# Patient Record
Sex: Female | Born: 1990 | Race: Black or African American | Hispanic: No | Marital: Single | Smoking: Former smoker
Health system: Southern US, Community
[De-identification: ages and names within clinical notes are randomized; demographics above are authoritative.]

## PROBLEM LIST (undated history)

## (undated) DIAGNOSIS — F32A Depression, unspecified: Secondary | ICD-10-CM

## (undated) DIAGNOSIS — A64 Unspecified sexually transmitted disease: Secondary | ICD-10-CM

## (undated) DIAGNOSIS — F329 Major depressive disorder, single episode, unspecified: Secondary | ICD-10-CM

## (undated) DIAGNOSIS — M1990 Unspecified osteoarthritis, unspecified site: Secondary | ICD-10-CM

## (undated) DIAGNOSIS — F3181 Bipolar II disorder: Secondary | ICD-10-CM

## (undated) DIAGNOSIS — G4730 Sleep apnea, unspecified: Secondary | ICD-10-CM

## (undated) HISTORY — PX: NO PAST SURGERIES: SHX2092

## (undated) HISTORY — DX: Bipolar II disorder: F31.81

## (undated) HISTORY — DX: Unspecified osteoarthritis, unspecified site: M19.90

## (undated) HISTORY — DX: Major depressive disorder, single episode, unspecified: F32.9

## (undated) HISTORY — DX: Sleep apnea, unspecified: G47.30

## (undated) HISTORY — DX: Depression, unspecified: F32.A

---
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
## 2010-01-07 ENCOUNTER — Emergency Department (HOSPITAL_BASED_OUTPATIENT_CLINIC_OR_DEPARTMENT_OTHER): Admission: EM | Admit: 2010-01-07 | Discharge: 2010-01-07 | Payer: Self-pay | Admitting: Emergency Medicine

## 2010-01-07 ENCOUNTER — Ambulatory Visit: Payer: Self-pay | Admitting: Diagnostic Radiology

## 2010-07-20 LAB — URINE CULTURE: Colony Count: 25000

## 2010-07-20 LAB — URINALYSIS, ROUTINE W REFLEX MICROSCOPIC
Glucose, UA: NEGATIVE mg/dL
Protein, ur: NEGATIVE mg/dL
Specific Gravity, Urine: 1.03 (ref 1.005–1.030)
pH: 5.5 (ref 5.0–8.0)

## 2010-07-20 LAB — PREGNANCY, URINE: Preg Test, Ur: NEGATIVE

## 2010-08-14 LAB — URINALYSIS, ROUTINE W REFLEX MICROSCOPIC
Bilirubin Urine: NEGATIVE
Glucose, UA: NEGATIVE mg/dL
Ketones, ur: NEGATIVE mg/dL
Nitrite: POSITIVE — AB
Protein, ur: 100 mg/dL — AB
Specific Gravity, Urine: >1.03 — ABNORMAL HIGH (ref 1.005–1.030)

## 2010-08-14 LAB — WET PREP, GENITAL: Trich, Wet Prep: NONE SEEN

## 2010-08-14 LAB — GC/CHLAMYDIA PROBE AMP, GENITAL: GC Probe Amp, Genital: NEGATIVE

## 2010-10-27 ENCOUNTER — Inpatient Hospital Stay (HOSPITAL_COMMUNITY)
Admission: AD | Admit: 2010-10-27 | Discharge: 2010-10-27 | Disposition: A | Discharge status: Home / Self Care | Payer: Self-pay | Source: Ambulatory Visit | Attending: Obstetrics & Gynecology | Admitting: Obstetrics & Gynecology

## 2010-10-27 DIAGNOSIS — R197 Diarrhea, unspecified: Secondary | ICD-10-CM

## 2010-10-27 DIAGNOSIS — F319 Bipolar disorder, unspecified: Secondary | ICD-10-CM

## 2010-10-27 DIAGNOSIS — R109 Unspecified abdominal pain: Secondary | ICD-10-CM

## 2010-10-27 DIAGNOSIS — N898 Other specified noninflammatory disorders of vagina: Secondary | ICD-10-CM

## 2010-10-27 LAB — URINE MICROSCOPIC-ADD ON

## 2010-10-27 LAB — URINALYSIS, ROUTINE W REFLEX MICROSCOPIC
Glucose, UA: NEGATIVE mg/dL
Ketones, ur: NEGATIVE mg/dL
Leukocytes, UA: NEGATIVE
Specific Gravity, Urine: >1.03 — ABNORMAL HIGH (ref 1.005–1.030)
Urobilinogen, UA: 0.2 mg/dL (ref 0.0–1.0)

## 2010-10-27 LAB — CBC
MCV: 91.1 fL (ref 78.0–100.0)
RBC: 3.84 MIL/uL — ABNORMAL LOW (ref 3.87–5.11)
WBC: 11.2 10*3/uL — ABNORMAL HIGH (ref 4.0–10.5)

## 2010-10-27 LAB — WET PREP, GENITAL: Trich, Wet Prep: NONE SEEN

## 2010-10-27 LAB — POCT PREGNANCY, URINE: Preg Test, Ur: NEGATIVE

## 2010-10-31 LAB — GC/CHLAMYDIA PROBE AMP, GENITAL
Chlamydia, DNA Probe: POSITIVE — AB
GC Probe Amp, Genital: NEGATIVE

## 2011-02-06 LAB — URINE MICROSCOPIC-ADD ON

## 2011-02-06 LAB — WET PREP, GENITAL
Clue Cells Wet Prep HPF POC: NONE SEEN
WBC, Wet Prep HPF POC: NONE SEEN
Yeast Wet Prep HPF POC: NONE SEEN

## 2011-02-06 LAB — URINALYSIS, ROUTINE W REFLEX MICROSCOPIC
Glucose, UA: NEGATIVE mg/dL
pH: 7 (ref 5.0–8.0)

## 2011-02-06 LAB — RPR: RPR Ser Ql: NONREACTIVE

## 2011-02-09 LAB — POCT URINALYSIS DIP (DEVICE)
Bilirubin Urine: NEGATIVE
Hgb urine dipstick: NEGATIVE
Ketones, ur: NEGATIVE mg/dL
Protein, ur: NEGATIVE mg/dL
Specific Gravity, Urine: 1.015 (ref 1.005–1.030)
pH: 7 (ref 5.0–8.0)

## 2011-02-13 LAB — DIFFERENTIAL
Basophils Relative: 0
Eosinophils Absolute: 0 — ABNORMAL LOW
Eosinophils Relative: 0
Lymphs Abs: 1.6
Neutrophils Relative %: 70

## 2011-02-13 LAB — CBC
HCT: 36.1
MCHC: 35
MCV: 91.6
Platelets: 311
RDW: 12.7
WBC: 9.8

## 2011-02-13 LAB — BASIC METABOLIC PANEL
BUN: 4 — ABNORMAL LOW
CO2: 24
Chloride: 98
Creatinine, Ser: 0.53
Glucose, Bld: 92
Potassium: 3.7

## 2011-02-13 LAB — PREGNANCY, URINE: Preg Test, Ur: NEGATIVE

## 2011-03-26 ENCOUNTER — Emergency Department (HOSPITAL_COMMUNITY)
Admission: EM | Admit: 2011-03-26 | Discharge: 2011-03-26 | Disposition: A | Discharge status: Home / Self Care | Payer: Self-pay | Attending: Emergency Medicine | Admitting: Emergency Medicine

## 2011-03-26 ENCOUNTER — Encounter: Payer: Self-pay | Admitting: *Deleted

## 2011-03-26 DIAGNOSIS — X58XXXA Exposure to other specified factors, initial encounter: Secondary | ICD-10-CM | POA: Insufficient documentation

## 2011-03-26 DIAGNOSIS — H16009 Unspecified corneal ulcer, unspecified eye: Secondary | ICD-10-CM | POA: Insufficient documentation

## 2011-03-26 DIAGNOSIS — S058X9A Other injuries of unspecified eye and orbit, initial encounter: Secondary | ICD-10-CM | POA: Insufficient documentation

## 2011-03-26 DIAGNOSIS — S0500XA Injury of conjunctiva and corneal abrasion without foreign body, unspecified eye, initial encounter: Secondary | ICD-10-CM

## 2011-03-26 MED ORDER — PROPARACAINE HCL 0.5 % OP SOLN
OPHTHALMIC | Status: AC
  Administered 2011-03-26: 12:00:00
  Filled 2011-03-26: qty 15

## 2011-03-26 MED ORDER — HYDROCODONE-ACETAMINOPHEN 5-325 MG PO TABS: 1.0000 | ORAL_TABLET | Freq: Four times a day (QID) | ORAL | Status: AC | PRN

## 2011-03-26 MED ORDER — TETRACAINE HCL 0.5 % OP SOLN: 2.0000 [drp] | Freq: Once | OPHTHALMIC | Status: DC

## 2011-03-26 MED ORDER — SULFACETAMIDE SODIUM 10 % OP SOLN: 2.0000 [drp] | OPHTHALMIC | Status: AC

## 2011-03-26 NOTE — ED Notes (Signed)
Patient given discharge instructions, information, prescriptions, and diet order. Patient states that they adequately understand discharge information given and to return to ED if symptoms return or worsen.    Pt given follow up information. Patient given 2 prescriptions.

## 2011-03-26 NOTE — ED Notes (Signed)
Pt states she has contacts that she has been wearing for a year. Pt states she also allergic reaction to apple juice and drink apple juice on sat.  Pt states her eye is swollen and draining

## 2011-03-26 NOTE — ED Provider Notes (Signed)
History     CSN: [id number] Arrival date & time: 03/26/2011 11:09 AM   First MD Initiated Contact with Patient 03/26/11 1204      Chief Complaint  Patient presents with  . Eye Pain    left eye, onset on Saturday    (Consider location/radiation/quality/duration/timing/severity/associated sxs/prior treatment) HPI Patient states that 3 days ago.  She started having pain in her left eye.  She states that she also wears contact lenses and slept in them the night before.  She states that the light bothers her eyes, along with movement of her eye.  She denies visual loss, headache, vomiting, nausea, or known trauma.  Past Medical History  Diagnosis Date  . Bipolar 1 disorder     History reviewed. No pertinent past surgical history.  Family History  Problem Relation Age of Onset  . Diabetes Father   . Stroke Father     History  Substance Use Topics  . Smoking status: Former Games developer  . Smokeless tobacco: Former Neurosurgeon  . Alcohol Use: Yes     pt states she drinks a few times a week    OB History    Grav Para Term Preterm Abortions TAB SAB Ect Mult Living                  Review of Systems  All other systems reviewed and are negative.   All positives and negatives were reviewed in the history of present illness Allergies  Apple juice  Home Medications   Current Outpatient Rx  Name Route Sig Dispense Refill  . IBUPROFEN 200 MG PO TABS Oral Take 400 mg by mouth every 6 (six) hours as needed. For headache    . NAPHAZOLINE HCL 0.012 % OP SOLN Both Eyes Place 2 drops into both eyes 4 (four) times daily. For eyes hurting       BP 126/86  Pulse 92  Temp(Src) 98.7 F (37.1 C) (Oral)  Resp 18  SpO2 99%  LMP 02/23/2011  Physical Exam  Constitutional: She appears well-developed and well-nourished.  HENT:  Head: Normocephalic and atraumatic.  Eyes:  Slit lamp exam:      The left eye shows corneal abrasion, corneal ulcer and fluorescein uptake. The left eye shows no  foreign body.  Neck: Normal range of motion. Neck supple.  Cardiovascular: Normal rate and regular rhythm.   Pulmonary/Chest: Effort normal and breath sounds normal.  Skin: No rash noted.    ED Course  Procedures (including critical care time)        MDM   Patient referred to ophthalmology for further evaluation.  She is a central pinpoint area of fluorescein dye uptake on the cornea.        Carlyle Dolly, [place] 03/26/11 1302

## 2011-03-28 NOTE — ED Provider Notes (Signed)
Medical screening examination/treatment/procedure(s) were conducted as a shared visit with non-physician practitioner(s) and myself.  I personally evaluated the patient during the encounter  Toy Baker, MD 03/28/11 [phone]

## 2011-05-24 ENCOUNTER — Emergency Department (INDEPENDENT_AMBULATORY_CARE_PROVIDER_SITE_OTHER)
Admission: EM | Admit: 2011-05-24 | Discharge: 2011-05-24 | Disposition: A | Discharge status: Home / Self Care | Payer: Self-pay | Source: Home / Self Care | Attending: Family Medicine | Admitting: Family Medicine

## 2011-05-24 ENCOUNTER — Encounter (HOSPITAL_COMMUNITY): Payer: Self-pay | Admitting: Emergency Medicine

## 2011-05-24 DIAGNOSIS — F419 Anxiety disorder, unspecified: Secondary | ICD-10-CM

## 2011-05-24 DIAGNOSIS — F411 Generalized anxiety disorder: Secondary | ICD-10-CM

## 2011-05-24 MED ORDER — BUSPIRONE HCL 15 MG PO TABS: 15.0000 mg | ORAL_TABLET | ORAL | Status: DC

## 2011-05-24 NOTE — ED Notes (Signed)
PT HERE WITH C/O FREQ PANIC ATTACKS IN THE PAST 2WKS,NERVOUS,ANXIETY AND POOR APPETITE DUE TO INCREASED STRESS.PT WAS SEEN 2 YRS AGO FOR SX BY [facility] AND PRESCRIBED MEDS BUT PT UNSURE OF NAME.PT HAS SEEN [facility] AND TOLD TO COME HERE FOR RX REFILL

## 2011-05-24 NOTE — ED Provider Notes (Signed)
History     CSN: [id number]  Arrival date & time 05/24/11  1038   First MD Initiated Contact with Patient 05/24/11 1046      Chief Complaint  Patient presents with  . Panic Attack    (Consider location/radiation/quality/duration/timing/severity/associated sxs/prior treatment) HPI Comments: For the last 2 weeks, been feeling anxious and been having panic attacks" " Not eating well"  "I use to take medicines for this, but don't know what medicines I took"  " There is a lot of things going on"  The history is provided by the patient.    Past Medical History  Diagnosis Date  . Bipolar 1 disorder     History reviewed. No pertinent past surgical history.  Family History  Problem Relation Age of Onset  . Diabetes Father   . Stroke Father     History  Substance Use Topics  . Smoking status: Former Games developer  . Smokeless tobacco: Former Neurosurgeon  . Alcohol Use: Yes     pt states she drinks a few times a week    OB History    Grav Para Term Preterm Abortions TAB SAB Ect Mult Living                  Review of Systems  Constitutional: Negative for fever and fatigue.  Psychiatric/Behavioral: Positive for sleep disturbance. Negative for suicidal ideas and self-injury. The patient is nervous/anxious.     Allergies  Apple juice  Home Medications   Current Outpatient Rx  Name Route Sig Dispense Refill  . BUSPIRONE HCL 15 MG PO TABS Oral Take 1 tablet (15 mg total) by mouth 1 day or 1 dose. 15 tablet 0  . IBUPROFEN 200 MG PO TABS Oral Take 400 mg by mouth every 6 (six) hours as needed. For headache    . NAPHAZOLINE HCL 0.012 % OP SOLN Both Eyes Place 2 drops into both eyes 4 (four) times daily. For eyes hurting       BP 125/76  Pulse 73  Temp(Src) 99.3 F (37.4 C) (Oral)  Resp 20  SpO2 100%  LMP 04/29/2011  Physical Exam  Constitutional: She appears well-developed and well-nourished. No distress.  Neurological: She is alert.  Psychiatric: Her speech is normal and  behavior is normal. Judgment and thought content normal. Her mood appears anxious. Her affect is not inappropriate. Cognition and memory are normal.    ED Course  Procedures (including critical care time)  Labs Reviewed - No data to display No results found.   1. Anxiety       MDM  GAD with panic attacks-recurrent, on no anxiolitics        Jimmie Molly, MD 05/24/11 2335

## 2011-10-03 ENCOUNTER — Encounter (HOSPITAL_COMMUNITY): Payer: Self-pay | Admitting: *Deleted

## 2011-10-03 ENCOUNTER — Emergency Department (INDEPENDENT_AMBULATORY_CARE_PROVIDER_SITE_OTHER)
Admission: EM | Admit: 2011-10-03 | Discharge: 2011-10-03 | Disposition: A | Discharge status: Home / Self Care | Payer: BC Managed Care – PPO | Source: Home / Self Care | Attending: Emergency Medicine | Admitting: Emergency Medicine

## 2011-10-03 DIAGNOSIS — S61209A Unspecified open wound of unspecified finger without damage to nail, initial encounter: Secondary | ICD-10-CM

## 2011-10-03 DIAGNOSIS — S61219A Laceration without foreign body of unspecified finger without damage to nail, initial encounter: Secondary | ICD-10-CM

## 2011-10-03 MED ORDER — TETANUS-DIPHTH-ACELL PERTUSSIS 5-2.5-18.5 LF-MCG/0.5 IM SUSP
0.5000 mL | Freq: Once | INTRAMUSCULAR | Status: AC
  Administered 2011-10-03: 0.5 mL via INTRAMUSCULAR

## 2011-10-03 MED ORDER — TETANUS-DIPHTH-ACELL PERTUSSIS 5-2.5-18.5 LF-MCG/0.5 IM SUSP
INTRAMUSCULAR | Status: AC
  Filled 2011-10-03: qty 0.5

## 2011-10-03 NOTE — ED Notes (Signed)
Pt  Sustained  A  Laceration to  Base of her r  Small  Finger      The  laceration is  Superficial    Bleeding  Has  Subsided         The        Pt  Is  Unsure  Of  Her  Last  Tetanus    Shot

## 2011-10-03 NOTE — Discharge Instructions (Signed)
You had a superficial laceration and uncomplicated. Read discharge instructions for further information.

## 2011-10-03 NOTE — ED Provider Notes (Signed)
History     CSN: [id number]  Arrival date & time 10/03/11  1425   First MD Initiated Contact with Patient 10/03/11 1511      Chief Complaint  Patient presents with  . Laceration    (Consider location/radiation/quality/duration/timing/severity/associated sxs/prior treatment) HPI Comments: It's been more than 10 years since I have had a tetanus shot. I cut my pinky finger with a glass in the house out of the hole has lived there before. Looks small but it bled some and then it stopped. "I'm fine moving my finger". Patient denies any distal weakness to the affected finger restrictions in range of motion. No distal tingling or numbness sensation.  Patient is a 21 y.o. female presenting with skin laceration. The history is provided by the patient.  Laceration  The incident occurred 3 to 5 hours ago. The laceration is located on the right hand. The laceration is 1 cm in size. The pain is at a severity of 2/10. The pain is mild. The pain has been constant since onset. She reports no foreign bodies present. Her tetanus status is out of date.    Past Medical History  Diagnosis Date  . Bipolar 1 disorder     History reviewed. No pertinent past surgical history.  Family History  Problem Relation Age of Onset  . Diabetes Father   . Stroke Father     History  Substance Use Topics  . Smoking status: Former Games developer  . Smokeless tobacco: Former Neurosurgeon  . Alcohol Use: Yes     pt states she drinks a few times a week    OB History    Grav Para Term Preterm Abortions TAB SAB Ect Mult Living                  Review of Systems  Constitutional: Negative for fever, chills, diaphoresis, activity change, appetite change and fatigue.  Eyes: Negative for itching.  Musculoskeletal: Negative for joint swelling.  Skin: Positive for wound. Negative for color change, pallor and rash.    Allergies  Apple juice  Home Medications   Current Outpatient Rx  Name Route Sig Dispense Refill  .  BUSPIRONE HCL 15 MG PO TABS Oral Take 1 tablet (15 mg total) by mouth 1 day or 1 dose. 15 tablet 0  . IBUPROFEN 200 MG PO TABS Oral Take 400 mg by mouth every 6 (six) hours as needed. For headache    . NAPHAZOLINE HCL 0.012 % OP SOLN Both Eyes Place 2 drops into both eyes 4 (four) times daily. For eyes hurting       BP 125/84  Pulse 78  Temp(Src) 99 F (37.2 C) (Oral)  Resp 19  SpO2 97%  LMP 09/13/2011  Physical Exam  Constitutional: She is oriented to person, place, and time. She appears well-developed and well-nourished. No distress.  HENT:  Head: Normocephalic.  Musculoskeletal:       Hands: Neurological: She is alert and oriented to person, place, and time. No cranial nerve deficit. She exhibits normal muscle tone. Coordination normal.  Skin: No rash noted.    ED Course  Procedures (including critical care time)  Labs Reviewed - No data to display No results found.   1. Laceration of finger   2. Laceration was Steri-Stripped as it was not a candidate for sutures 3. tetanus status was updated today.    MDM  Laceration to the fifth right finger palmar surface. Patient with no tendon or sensorial deficiencies. Laceration was superficial of  about 0.8 cm. Patient mainly concerned for making sure she was updated with her tetanus.        Jimmie Molly, MD 10/03/11 [phone]

## 2011-10-03 NOTE — ED Notes (Signed)
Patient  Is  Unsure  Of  Her  Last  Tetanus  Shot

## 2012-02-14 ENCOUNTER — Inpatient Hospital Stay (HOSPITAL_COMMUNITY)
Admission: AD | Admit: 2012-02-14 | Discharge: 2012-02-14 | Disposition: A | Discharge status: Home / Self Care | Payer: BC Managed Care – PPO | Source: Ambulatory Visit | Attending: Obstetrics and Gynecology | Admitting: Obstetrics and Gynecology

## 2012-02-14 ENCOUNTER — Inpatient Hospital Stay (HOSPITAL_COMMUNITY): Payer: BC Managed Care – PPO

## 2012-02-14 ENCOUNTER — Encounter (HOSPITAL_COMMUNITY): Payer: Self-pay

## 2012-02-14 DIAGNOSIS — R112 Nausea with vomiting, unspecified: Secondary | ICD-10-CM | POA: Insufficient documentation

## 2012-02-14 DIAGNOSIS — N739 Female pelvic inflammatory disease, unspecified: Secondary | ICD-10-CM

## 2012-02-14 DIAGNOSIS — IMO0001 Reserved for inherently not codable concepts without codable children: Secondary | ICD-10-CM

## 2012-02-14 DIAGNOSIS — M7918 Myalgia, other site: Secondary | ICD-10-CM

## 2012-02-14 DIAGNOSIS — M545 Low back pain, unspecified: Secondary | ICD-10-CM | POA: Insufficient documentation

## 2012-02-14 HISTORY — DX: Unspecified sexually transmitted disease: A64

## 2012-02-14 LAB — POCT PREGNANCY, URINE: Preg Test, Ur: NEGATIVE

## 2012-02-14 LAB — URINALYSIS, ROUTINE W REFLEX MICROSCOPIC
Glucose, UA: NEGATIVE mg/dL
Hgb urine dipstick: NEGATIVE
Ketones, ur: NEGATIVE mg/dL
Leukocytes, UA: NEGATIVE
pH: 6.5 (ref 5.0–8.0)

## 2012-02-14 LAB — CBC
HCT: 38.4 % (ref 36.0–46.0)
MCHC: 33.3 g/dL (ref 30.0–36.0)
MCV: 91.2 fL (ref 78.0–100.0)
Platelets: 363 10*3/uL (ref 150–400)
RDW: 12.8 % (ref 11.5–15.5)

## 2012-02-14 LAB — WET PREP, GENITAL
Trich, Wet Prep: NONE SEEN
Yeast Wet Prep HPF POC: NONE SEEN

## 2012-02-14 MED ORDER — DOXYCYCLINE HYCLATE 100 MG PO CAPS: ORAL_CAPSULE | ORAL | Status: DC

## 2012-02-14 MED ORDER — KETOROLAC TROMETHAMINE 60 MG/2ML IM SOLN
60.0000 mg | INTRAMUSCULAR | Status: AC
  Administered 2012-02-14: 60 mg via INTRAMUSCULAR
  Filled 2012-02-14: qty 2

## 2012-02-14 MED ORDER — IBUPROFEN 600 MG PO TABS: 600.0000 mg | ORAL_TABLET | Freq: Four times a day (QID) | ORAL | Status: DC | PRN

## 2012-02-14 MED ORDER — CEFTRIAXONE SODIUM 250 MG IJ SOLR
250.0000 mg | INTRAMUSCULAR | Status: AC
  Administered 2012-02-14: 250 mg via INTRAMUSCULAR
  Filled 2012-02-14: qty 250

## 2012-02-14 MED ORDER — DOXYCYCLINE HYCLATE 100 MG PO CAPS: 100.0000 mg | ORAL_CAPSULE | Freq: Two times a day (BID) | ORAL | Status: DC

## 2012-02-14 NOTE — MAU Provider Note (Signed)
Chief Complaint: Back Pain and Abdominal Pain   First Provider Initiated Contact with Patient 02/14/12 1001     SUBJECTIVE HPI: Nichole Page is a 20 y.o. G1P1001 who presents to maternity admissions reporting low back pain with nausea and vomiting x1/day for three days.  She had diarrhea on the day this started but this resolved. Today she also had onset of some abdominal cramping. Patient's last menstrual period was 02/03/2012.  Her periods are regular and her last one was normal for her.  She denies vaginal bleeding, vaginal itching/burning, urinary symptoms, h/a, dizziness, or fever/chills.  She denies risks for STDs at this time.    Past Medical History  Diagnosis Date  . Bipolar 1 disorder   . STI (sexually transmitted infection)    Past Surgical History  Procedure Date  . No past surgeries    History   Social History  . Marital Status: Married    Spouse Name: N/A    Number of Children: N/A  . Years of Education: N/A   Occupational History  . Not on file.   Social History Main Topics  . Smoking status: Former Games developer  . Smokeless tobacco: Former Neurosurgeon  . Alcohol Use: Yes     pt states she drinks a few times a week  . Drug Use: No  . Sexually Active:    Other Topics Concern  . Not on file   Social History Narrative  . No narrative on file   No current facility-administered medications on file prior to encounter.   Current Outpatient Prescriptions on File Prior to Encounter  Medication Sig Dispense Refill  . ibuprofen (ADVIL,MOTRIN) 200 MG tablet Take 400 mg by mouth every 6 (six) hours as needed. For headache       Allergies  Allergen Reactions  . Apple Juice Nausea And Vomiting and Other (See Comments)    Eyes swell    ROS: Pertinent items in HPI  OBJECTIVE Blood pressure 110/71, pulse 69, temperature 98.1 F (36.7 C), resp. rate 18, height 5\' 7"  (1.702 m), weight 99.066 kg (218 lb 6.4 oz), last menstrual period 02/03/2012. GENERAL: Well-developed,  well-nourished female in no acute distress.  HEENT: Normocephalic HEART: normal rate RESP: normal effort ABDOMEN: Soft, non-tender EXTREMITIES: Nontender, no edema NEURO: Alert and oriented Pelvic exam: Cervix pink, visually closed, without lesion, scant white creamy discharge, vaginal walls and external genitalia normal Bimanual exam: Cervix 0/long/high, firm, anterior, positive CMT, uterus nontender, nonenlarged, adnexa with mild tenderness bilaterally, no enlargement, or mass  LAB RESULTS Results for orders placed during the hospital encounter of 02/14/12 (from the past 24 hour(s))  URINALYSIS, ROUTINE W REFLEX MICROSCOPIC     Status: Normal   Collection Time   02/14/12  9:06 AM      Component Value Range   Color, Urine YELLOW  YELLOW   APPearance CLEAR  CLEAR   Specific Gravity, Urine 1.020  1.005 - 1.030   pH 6.5  5.0 - 8.0   Glucose, UA NEGATIVE  NEGATIVE mg/dL   Hgb urine dipstick NEGATIVE  NEGATIVE   Bilirubin Urine NEGATIVE  NEGATIVE   Ketones, ur NEGATIVE  NEGATIVE mg/dL   Protein, ur NEGATIVE  NEGATIVE mg/dL   Urobilinogen, UA 0.2  0.0 - 1.0 mg/dL   Nitrite NEGATIVE  NEGATIVE   Leukocytes, UA NEGATIVE  NEGATIVE  POCT PREGNANCY, URINE     Status: Normal   Collection Time   02/14/12  9:14 AM      Component Value Range  Preg Test, Ur NEGATIVE  NEGATIVE  CBC     Status: Normal   Collection Time   02/14/12 10:50 AM      Component Value Range   WBC 7.1  4.0 - 10.5 K/uL   RBC 4.21  3.87 - 5.11 MIL/uL   Hemoglobin 12.8  12.0 - 15.0 g/dL   HCT 16.1  09.6 - 04.5 %   MCV 91.2  78.0 - 100.0 fL   MCH 30.4  26.0 - 34.0 pg   MCHC 33.3  30.0 - 36.0 g/dL   RDW 40.9  81.1 - 91.4 %   Platelets 363  150 - 400 K/uL  WET PREP, GENITAL     Status: Abnormal   Collection Time   02/14/12 11:34 AM      Component Value Range   Yeast Wet Prep HPF POC NONE SEEN  NONE SEEN   Trich, Wet Prep NONE SEEN  NONE SEEN   Clue Cells Wet Prep HPF POC NONE SEEN  NONE SEEN   WBC, Wet Prep HPF  POC MANY (*) NONE SEEN   IMAGING US Transvaginal Non-ob  02/14/2012  *RADIOLOGY REPORT*  Clinical Data: Left lower quadrant pelvic and back pain.  LMP 02/03/2012  TRANSABDOMINAL AND TRANSVAGINAL ULTRASOUND OF PELVIS Technique:  Both transabdominal and transvaginal ultrasound examinations of the pelvis were performed. Transabdominal technique was performed for global imaging of the pelvis including uterus, ovaries, adnexal regions, and pelvic cul-de-sac.  It was necessary to proceed with endovaginal exam following the transabdominal exam to visualize the myometrium, endometrium and adnexa.  Comparison:  None  Findings:  Uterus: Is retroverted and retroflexed and demonstrates a sagittal length of 8.1 cm, depth of 3.8 cm and width of 5.3 cm.  The uterine myometrium is mildly heterogeneous with no focal abnormality seen  Endometrium: Appears trilayered with a width of 9.4 mm.  No areas of focal thickening or heterogeneity are seen and this would correlate with a periovulatory endometrium and correspond with the provided LMP of 02/03/2012  Right ovary:  Has a normal appearance measuring 2.2 x 3.7 x 2.7 cm  Left ovary: Has a normal appearance measuring 2.0 x 2.6 x 1.8 cm  Other findings: A small amount of fluid is identified in the cul-de- sac and left adnexa adjacent to the left ovary.  IMPRESSION: Normal periovulatory pelvic ultrasound.  It should be noted that the patient did experience tenderness with scanning on the left side of the abdomen both transabdominally and endovaginally.  If further evaluation of the non gynecologic aspects of the pelvis is desired, abdominal pelvic CT would be recommended.   Original Report Authenticated By: Bertha Stakes, M.D.    US Pelvis Complete  02/14/2012  *RADIOLOGY REPORT*  Clinical Data: Left lower quadrant pelvic and back pain.  LMP 02/03/2012  TRANSABDOMINAL AND TRANSVAGINAL ULTRASOUND OF PELVIS Technique:  Both transabdominal and transvaginal ultrasound  examinations of the pelvis were performed. Transabdominal technique was performed for global imaging of the pelvis including uterus, ovaries, adnexal regions, and pelvic cul-de-sac.  It was necessary to proceed with endovaginal exam following the transabdominal exam to visualize the myometrium, endometrium and adnexa.  Comparison:  None  Findings:  Uterus: Is retroverted and retroflexed and demonstrates a sagittal length of 8.1 cm, depth of 3.8 cm and width of 5.3 cm.  The uterine myometrium is mildly heterogeneous with no focal abnormality seen  Endometrium: Appears trilayered with a width of 9.4 mm.  No areas of focal thickening or heterogeneity are seen  and this would correlate with a periovulatory endometrium and correspond with the provided LMP of 02/03/2012  Right ovary:  Has a normal appearance measuring 2.2 x 3.7 x 2.7 cm  Left ovary: Has a normal appearance measuring 2.0 x 2.6 x 1.8 cm  Other findings: A small amount of fluid is identified in the cul-de- sac and left adnexa adjacent to the left ovary.  IMPRESSION: Normal periovulatory pelvic ultrasound.  It should be noted that the patient did experience tenderness with scanning on the left side of the abdomen both transabdominally and endovaginally.  If further evaluation of the non gynecologic aspects of the pelvis is desired, abdominal pelvic CT would be recommended.   Original Report Authenticated By: Bertha Stakes, M.D.     ASSESSMENT 1. Musculoskeletal pain   2. Pelvic inflammatory disease (PID)     PLAN Called Dr Tenny Craw to discuss assessment and findings, including U/S Rocephin 250 mg IM x1 dose Discharge home Doxycycline 100 mg BID x14 days F/U with Dr Tenny Craw Return to MAU as needed    Medication List     As of 02/14/2012  1:55 PM    TAKE these medications         doxycycline 100 MG capsule   Commonly known as: VIBRAMYCIN   Take 1 capsule (100 mg total) by mouth 2 (two) times daily.      ibuprofen 600 MG tablet    Commonly known as: ADVIL,MOTRIN   Take 1 tablet (600 mg total) by mouth every 6 (six) hours as needed for pain.           Sharen Counter Certified Nurse-Midwife 02/14/2012  10:08 AM

## 2012-02-14 NOTE — MAU Note (Signed)
Patient presents with back and abdominal pain and vomiting x 3 days, LMP 9/29

## 2012-02-15 LAB — GC/CHLAMYDIA PROBE AMP, GENITAL: Chlamydia, DNA Probe: NEGATIVE

## 2012-04-02 ENCOUNTER — Emergency Department (HOSPITAL_COMMUNITY): Payer: BC Managed Care – PPO

## 2012-04-02 ENCOUNTER — Encounter (HOSPITAL_COMMUNITY): Payer: Self-pay | Admitting: Radiology

## 2012-04-02 ENCOUNTER — Encounter (HOSPITAL_COMMUNITY): Admission: EM | Disposition: A | Discharge status: Home / Self Care | Payer: Self-pay | Source: Home / Self Care

## 2012-04-02 ENCOUNTER — Encounter (HOSPITAL_COMMUNITY): Payer: Self-pay | Admitting: Anesthesiology

## 2012-04-02 ENCOUNTER — Ambulatory Visit (HOSPITAL_COMMUNITY): Payer: BC Managed Care – PPO

## 2012-04-02 ENCOUNTER — Emergency Department (HOSPITAL_COMMUNITY): Payer: BC Managed Care – PPO | Admitting: Anesthesiology

## 2012-04-02 ENCOUNTER — Ambulatory Visit (HOSPITAL_COMMUNITY)
Admission: EM | Admit: 2012-04-02 | Discharge: 2012-04-08 | Disposition: A | Discharge status: Home / Self Care | Payer: BC Managed Care – PPO | Attending: General Surgery | Admitting: General Surgery

## 2012-04-02 DIAGNOSIS — S27329A Contusion of lung, unspecified, initial encounter: Secondary | ICD-10-CM | POA: Insufficient documentation

## 2012-04-02 DIAGNOSIS — S92109B Unspecified fracture of unspecified talus, initial encounter for open fracture: Principal | ICD-10-CM | POA: Insufficient documentation

## 2012-04-02 DIAGNOSIS — S01511A Laceration without foreign body of lip, initial encounter: Secondary | ICD-10-CM | POA: Diagnosis present

## 2012-04-02 DIAGNOSIS — S8253XB Displaced fracture of medial malleolus of unspecified tibia, initial encounter for open fracture type I or II: Secondary | ICD-10-CM

## 2012-04-02 DIAGNOSIS — R0902 Hypoxemia: Secondary | ICD-10-CM | POA: Insufficient documentation

## 2012-04-02 DIAGNOSIS — S01501A Unspecified open wound of lip, initial encounter: Secondary | ICD-10-CM | POA: Insufficient documentation

## 2012-04-02 DIAGNOSIS — R109 Unspecified abdominal pain: Secondary | ICD-10-CM | POA: Insufficient documentation

## 2012-04-02 DIAGNOSIS — N390 Urinary tract infection, site not specified: Secondary | ICD-10-CM | POA: Diagnosis not present

## 2012-04-02 DIAGNOSIS — S20319A Abrasion of unspecified front wall of thorax, initial encounter: Secondary | ICD-10-CM | POA: Diagnosis present

## 2012-04-02 DIAGNOSIS — D62 Acute posthemorrhagic anemia: Secondary | ICD-10-CM | POA: Diagnosis not present

## 2012-04-02 DIAGNOSIS — F1010 Alcohol abuse, uncomplicated: Secondary | ICD-10-CM | POA: Insufficient documentation

## 2012-04-02 DIAGNOSIS — S93419A Sprain of calcaneofibular ligament of unspecified ankle, initial encounter: Secondary | ICD-10-CM | POA: Insufficient documentation

## 2012-04-02 DIAGNOSIS — IMO0002 Reserved for concepts with insufficient information to code with codable children: Secondary | ICD-10-CM | POA: Insufficient documentation

## 2012-04-02 DIAGNOSIS — R300 Dysuria: Secondary | ICD-10-CM | POA: Insufficient documentation

## 2012-04-02 DIAGNOSIS — Z79899 Other long term (current) drug therapy: Secondary | ICD-10-CM | POA: Insufficient documentation

## 2012-04-02 DIAGNOSIS — S82899B Other fracture of unspecified lower leg, initial encounter for open fracture type I or II: Secondary | ICD-10-CM | POA: Insufficient documentation

## 2012-04-02 DIAGNOSIS — J45909 Unspecified asthma, uncomplicated: Secondary | ICD-10-CM | POA: Insufficient documentation

## 2012-04-02 DIAGNOSIS — E876 Hypokalemia: Secondary | ICD-10-CM | POA: Insufficient documentation

## 2012-04-02 DIAGNOSIS — F10929 Alcohol use, unspecified with intoxication, unspecified: Secondary | ICD-10-CM

## 2012-04-02 DIAGNOSIS — S91309A Unspecified open wound, unspecified foot, initial encounter: Secondary | ICD-10-CM | POA: Insufficient documentation

## 2012-04-02 DIAGNOSIS — I10 Essential (primary) hypertension: Secondary | ICD-10-CM | POA: Insufficient documentation

## 2012-04-02 DIAGNOSIS — F319 Bipolar disorder, unspecified: Secondary | ICD-10-CM | POA: Insufficient documentation

## 2012-04-02 DIAGNOSIS — S92113B Displaced fracture of neck of unspecified talus, initial encounter for open fracture: Secondary | ICD-10-CM | POA: Diagnosis present

## 2012-04-02 HISTORY — PX: ORIF ANKLE FRACTURE: SHX5408

## 2012-04-02 HISTORY — PX: I&D EXTREMITY: SHX5045

## 2012-04-02 LAB — URINALYSIS, MICROSCOPIC ONLY
Glucose, UA: NEGATIVE mg/dL
Hgb urine dipstick: NEGATIVE
Ketones, ur: NEGATIVE mg/dL
Protein, ur: NEGATIVE mg/dL
Urobilinogen, UA: 0.2 mg/dL (ref 0.0–1.0)

## 2012-04-02 LAB — COMPREHENSIVE METABOLIC PANEL
ALT: 12 U/L (ref 0–35)
AST: 21 U/L (ref 0–37)
Alkaline Phosphatase: 72 U/L (ref 39–117)
CO2: 23 mEq/L (ref 19–32)
Chloride: 104 mEq/L (ref 96–112)
GFR calc Af Amer: >90 mL/min (ref >90)
GFR calc non Af Amer: >90 mL/min (ref >90)
Glucose, Bld: 133 mg/dL — ABNORMAL HIGH (ref 70–99)
Potassium: 3.6 mEq/L (ref 3.5–5.1)
Sodium: 141 mEq/L (ref 135–145)

## 2012-04-02 LAB — CBC
HCT: 37.3 % (ref 36.0–46.0)
Hemoglobin: 12.5 g/dL (ref 12.0–15.0)
MCH: 30 pg (ref 26.0–34.0)
MCHC: 33.5 g/dL (ref 30.0–36.0)
RDW: 12.9 % (ref 11.5–15.5)

## 2012-04-02 LAB — RAPID URINE DRUG SCREEN, HOSP PERFORMED
Amphetamines: NOT DETECTED
Barbiturates: NOT DETECTED
Benzodiazepines: NOT DETECTED
Cocaine: NOT DETECTED
Opiates: NOT DETECTED
Tetrahydrocannabinol: NOT DETECTED

## 2012-04-02 LAB — PROTIME-INR: Prothrombin Time: 12.3 seconds (ref 11.6–15.2)

## 2012-04-02 LAB — SAMPLE TO BLOOD BANK

## 2012-04-02 LAB — POCT I-STAT, CHEM 8
Calcium, Ion: 1.17 mmol/L (ref 1.12–1.23)
Glucose, Bld: 131 mg/dL — ABNORMAL HIGH (ref 70–99)
HCT: 40 % (ref 36.0–46.0)
Hemoglobin: 13.6 g/dL (ref 12.0–15.0)
TCO2: 22 mmol/L (ref 0–100)

## 2012-04-02 LAB — LACTIC ACID, PLASMA: Lactic Acid, Venous: 2.2 mmol/L (ref 0.5–2.2)

## 2012-04-02 LAB — ETHANOL: Alcohol, Ethyl (B): 252 mg/dL — ABNORMAL HIGH (ref 0–11)

## 2012-04-02 SURGERY — OPEN REDUCTION INTERNAL FIXATION (ORIF) ANKLE FRACTURE
Anesthesia: General | Site: Ankle | Laterality: Left | Wound class: Clean

## 2012-04-02 MED ORDER — DOCUSATE SODIUM 100 MG PO CAPS: 100.0000 mg | ORAL_CAPSULE | Freq: Two times a day (BID) | ORAL | Status: DC

## 2012-04-02 MED ORDER — ZOLPIDEM TARTRATE 5 MG PO TABS: 5.0000 mg | ORAL_TABLET | Freq: Every evening | ORAL | Status: DC | PRN

## 2012-04-02 MED ORDER — GENTAMICIN SULFATE 40 MG/ML IJ SOLN
120.0000 mg | Freq: Once | INTRAVENOUS | Status: AC
  Administered 2012-04-02: 120 mg via INTRAVENOUS
  Filled 2012-04-02: qty 3

## 2012-04-02 MED ORDER — PANTOPRAZOLE SODIUM 40 MG PO TBEC
40.0000 mg | DELAYED_RELEASE_TABLET | Freq: Every day | ORAL | Status: DC
  Administered 2012-04-02 – 2012-04-07 (×6): 40 mg via ORAL
  Filled 2012-04-02 (×7): qty 1

## 2012-04-02 MED ORDER — 0.9 % SODIUM CHLORIDE (POUR BTL) OPTIME
TOPICAL | Status: DC | PRN
  Administered 2012-04-02: 1000 mL

## 2012-04-02 MED ORDER — PROPOFOL 10 MG/ML IV BOLUS
INTRAVENOUS | Status: DC | PRN
  Administered 2012-04-02: 200 mg via INTRAVENOUS
  Administered 2012-04-02: 50 mg via INTRAVENOUS

## 2012-04-02 MED ORDER — SODIUM CHLORIDE 0.45 % IV SOLN: INTRAVENOUS | Status: DC

## 2012-04-02 MED ORDER — OXYCODONE-ACETAMINOPHEN 5-325 MG PO TABS
1.0000 | ORAL_TABLET | ORAL | Status: DC | PRN
  Administered 2012-04-03 – 2012-04-07 (×15): 2 via ORAL
  Filled 2012-04-02 (×13): qty 2
  Filled 2012-04-02: qty 7
  Filled 2012-04-02: qty 2

## 2012-04-02 MED ORDER — ONDANSETRON HCL 4 MG/2ML IJ SOLN: 4.0000 mg | Freq: Once | INTRAMUSCULAR | Status: DC | PRN

## 2012-04-02 MED ORDER — WHITE PETROLATUM GEL
Status: AC
  Administered 2012-04-02: 0.2
  Filled 2012-04-02: qty 5

## 2012-04-02 MED ORDER — LIDOCAINE HCL (CARDIAC) 20 MG/ML IV SOLN
INTRAVENOUS | Status: DC | PRN
  Administered 2012-04-02: 70 mg via INTRAVENOUS

## 2012-04-02 MED ORDER — SODIUM CHLORIDE 0.9 % IR SOLN
Status: DC | PRN
  Administered 2012-04-02: 08:00:00

## 2012-04-02 MED ORDER — SUCCINYLCHOLINE CHLORIDE 20 MG/ML IJ SOLN
INTRAMUSCULAR | Status: DC | PRN
  Administered 2012-04-02: 100 mg via INTRAVENOUS

## 2012-04-02 MED ORDER — GLYCOPYRROLATE 0.2 MG/ML IJ SOLN
INTRAMUSCULAR | Status: DC | PRN
  Administered 2012-04-02: 0.4 mg via INTRAVENOUS

## 2012-04-02 MED ORDER — HYDROMORPHONE HCL PF 1 MG/ML IJ SOLN
INTRAMUSCULAR | Status: AC
  Filled 2012-04-02: qty 1

## 2012-04-02 MED ORDER — ONDANSETRON HCL 4 MG PO TABS: 4.0000 mg | ORAL_TABLET | Freq: Four times a day (QID) | ORAL | Status: DC | PRN

## 2012-04-02 MED ORDER — ONDANSETRON HCL 4 MG/2ML IJ SOLN
INTRAMUSCULAR | Status: DC | PRN
  Administered 2012-04-02: 4 mg via INTRAVENOUS

## 2012-04-02 MED ORDER — METOCLOPRAMIDE HCL 5 MG/ML IJ SOLN
5.0000 mg | Freq: Three times a day (TID) | INTRAMUSCULAR | Status: DC | PRN
  Filled 2012-04-02: qty 2

## 2012-04-02 MED ORDER — LACTATED RINGERS IV SOLN
INTRAVENOUS | Status: DC | PRN
  Administered 2012-04-02 (×3): via INTRAVENOUS

## 2012-04-02 MED ORDER — ROCURONIUM BROMIDE 100 MG/10ML IV SOLN
INTRAVENOUS | Status: DC | PRN
  Administered 2012-04-02: 50 mg via INTRAVENOUS

## 2012-04-02 MED ORDER — ONDANSETRON HCL 4 MG/2ML IJ SOLN
4.0000 mg | Freq: Four times a day (QID) | INTRAMUSCULAR | Status: DC | PRN
  Administered 2012-04-02: 4 mg via INTRAVENOUS
  Filled 2012-04-02 (×2): qty 2

## 2012-04-02 MED ORDER — DEXAMETHASONE SODIUM PHOSPHATE 4 MG/ML IJ SOLN
INTRAMUSCULAR | Status: DC | PRN
  Administered 2012-04-02: 4 mg via INTRAVENOUS

## 2012-04-02 MED ORDER — CEFAZOLIN SODIUM-DEXTROSE 2-3 GM-% IV SOLR
2.0000 g | Freq: Four times a day (QID) | INTRAVENOUS | Status: AC
  Administered 2012-04-02 – 2012-04-04 (×7): 2 g via INTRAVENOUS
  Filled 2012-04-02 (×9): qty 50

## 2012-04-02 MED ORDER — METHOCARBAMOL 500 MG PO TABS
500.0000 mg | ORAL_TABLET | Freq: Four times a day (QID) | ORAL | Status: DC | PRN
  Administered 2012-04-03 – 2012-04-08 (×16): 500 mg via ORAL
  Filled 2012-04-02 (×16): qty 1

## 2012-04-02 MED ORDER — HYDROMORPHONE HCL PF 1 MG/ML IJ SOLN
0.2500 mg | INTRAMUSCULAR | Status: DC | PRN
  Administered 2012-04-02 (×2): 0.25 mg via INTRAVENOUS

## 2012-04-02 MED ORDER — INFLUENZA VIRUS VACC SPLIT PF IM SUSP
0.5000 mL | INTRAMUSCULAR | Status: AC
  Administered 2012-04-03: 0.5 mL via INTRAMUSCULAR
  Filled 2012-04-02: qty 0.5

## 2012-04-02 MED ORDER — METHOCARBAMOL 100 MG/ML IJ SOLN
500.0000 mg | Freq: Four times a day (QID) | INTRAVENOUS | Status: DC | PRN
  Filled 2012-04-02: qty 5

## 2012-04-02 MED ORDER — POTASSIUM CHLORIDE IN NACL 20-0.9 MEQ/L-% IV SOLN
INTRAVENOUS | Status: DC
  Administered 2012-04-02: 15:00:00 via INTRAVENOUS
  Administered 2012-04-03: 100 mL/h via INTRAVENOUS
  Administered 2012-04-03 – 2012-04-06 (×2): via INTRAVENOUS
  Filled 2012-04-02 (×16): qty 1000

## 2012-04-02 MED ORDER — GENTAMICIN SULFATE 40 MG/ML IJ SOLN
540.0000 mg | INTRAVENOUS | Status: DC
  Administered 2012-04-02 – 2012-04-06 (×5): 540 mg via INTRAVENOUS
  Filled 2012-04-02 (×6): qty 13.5

## 2012-04-02 MED ORDER — HYDROMORPHONE HCL PF 1 MG/ML IJ SOLN
1.0000 mg | Freq: Once | INTRAMUSCULAR | Status: AC
  Administered 2012-04-02: 1 mg via INTRAVENOUS
  Filled 2012-04-02: qty 1

## 2012-04-02 MED ORDER — CEFAZOLIN SODIUM 1-5 GM-% IV SOLN
1.0000 g | Freq: Once | INTRAVENOUS | Status: AC
  Administered 2012-04-02: 1 g via INTRAVENOUS
  Filled 2012-04-02: qty 50

## 2012-04-02 MED ORDER — IOHEXOL 300 MG/ML  SOLN
100.0000 mL | Freq: Once | INTRAMUSCULAR | Status: AC | PRN
  Administered 2012-04-02: 100 mL via INTRAVENOUS

## 2012-04-02 MED ORDER — METOCLOPRAMIDE HCL 10 MG PO TABS
5.0000 mg | ORAL_TABLET | Freq: Three times a day (TID) | ORAL | Status: DC | PRN
  Administered 2012-04-07: 10 mg via ORAL
  Filled 2012-04-02: qty 1

## 2012-04-02 MED ORDER — SENNOSIDES-DOCUSATE SODIUM 8.6-50 MG PO TABS
1.0000 | ORAL_TABLET | Freq: Every evening | ORAL | Status: DC | PRN
  Filled 2012-04-02: qty 1

## 2012-04-02 MED ORDER — PANTOPRAZOLE SODIUM 40 MG IV SOLR
40.0000 mg | Freq: Every day | INTRAVENOUS | Status: DC
  Filled 2012-04-02 (×7): qty 40

## 2012-04-02 MED ORDER — MAGNESIUM CITRATE PO SOLN
1.0000 | Freq: Once | ORAL | Status: AC | PRN
  Filled 2012-04-02: qty 296

## 2012-04-02 MED ORDER — CHLORHEXIDINE GLUCONATE 0.12 % MT SOLN
15.0000 mL | Freq: Two times a day (BID) | OROMUCOSAL | Status: DC
  Administered 2012-04-03 (×2): 15 mL via OROMUCOSAL
  Filled 2012-04-02 (×7): qty 15

## 2012-04-02 MED ORDER — SODIUM CHLORIDE 0.9 % IR SOLN
Status: DC | PRN
  Administered 2012-04-02 (×2): 3000 mL

## 2012-04-02 MED ORDER — ENOXAPARIN SODIUM 40 MG/0.4ML ~~LOC~~ SOLN
40.0000 mg | SUBCUTANEOUS | Status: DC
  Administered 2012-04-03: 40 mg via SUBCUTANEOUS
  Filled 2012-04-02 (×2): qty 0.4

## 2012-04-02 MED ORDER — FENTANYL CITRATE 0.05 MG/ML IJ SOLN
INTRAMUSCULAR | Status: DC | PRN
  Administered 2012-04-02 (×2): 50 ug via INTRAVENOUS
  Administered 2012-04-02: 150 ug via INTRAVENOUS

## 2012-04-02 MED ORDER — HYDROMORPHONE HCL PF 1 MG/ML IJ SOLN
0.5000 mg | INTRAMUSCULAR | Status: DC | PRN
  Administered 2012-04-02: 0.5 mg via INTRAVENOUS
  Administered 2012-04-03 – 2012-04-06 (×11): 1 mg via INTRAVENOUS
  Filled 2012-04-02 (×14): qty 1

## 2012-04-02 MED ORDER — HYDROCODONE-ACETAMINOPHEN 5-325 MG PO TABS
1.0000 | ORAL_TABLET | ORAL | Status: DC | PRN
  Administered 2012-04-02 – 2012-04-08 (×11): 2 via ORAL
  Filled 2012-04-02 (×11): qty 2

## 2012-04-02 MED ORDER — NEOSTIGMINE METHYLSULFATE 1 MG/ML IJ SOLN
INTRAMUSCULAR | Status: DC | PRN
  Administered 2012-04-02: 3 mg via INTRAVENOUS

## 2012-04-02 MED ORDER — DOCUSATE SODIUM 100 MG PO CAPS
100.0000 mg | ORAL_CAPSULE | Freq: Two times a day (BID) | ORAL | Status: DC
  Administered 2012-04-02 – 2012-04-08 (×12): 100 mg via ORAL
  Filled 2012-04-02 (×15): qty 1

## 2012-04-02 MED ORDER — HYDROMORPHONE HCL PF 1 MG/ML IJ SOLN
INTRAMUSCULAR | Status: AC
  Administered 2012-04-02: 1 mg
  Filled 2012-04-02: qty 1

## 2012-04-02 MED ORDER — TETANUS-DIPHTH-ACELL PERTUSSIS 5-2.5-18.5 LF-MCG/0.5 IM SUSP
0.5000 mL | Freq: Once | INTRAMUSCULAR | Status: AC
  Administered 2012-04-02: 0.5 mL via INTRAMUSCULAR
  Filled 2012-04-02: qty 0.5

## 2012-04-02 MED ORDER — BISACODYL 5 MG PO TBEC
5.0000 mg | DELAYED_RELEASE_TABLET | Freq: Every day | ORAL | Status: DC | PRN
  Administered 2012-04-05 – 2012-04-07 (×2): 5 mg via ORAL
  Filled 2012-04-02 (×2): qty 1

## 2012-04-02 MED ORDER — ONDANSETRON HCL 4 MG/2ML IJ SOLN: 4.0000 mg | Freq: Four times a day (QID) | INTRAMUSCULAR | Status: DC | PRN

## 2012-04-02 MED ORDER — PHENYLEPHRINE HCL 10 MG/ML IJ SOLN
INTRAMUSCULAR | Status: DC | PRN
  Administered 2012-04-02 (×3): 80 ug via INTRAVENOUS
  Administered 2012-04-02: 120 ug via INTRAVENOUS
  Administered 2012-04-02 (×2): 80 ug via INTRAVENOUS

## 2012-04-02 MED ORDER — DIPHENHYDRAMINE HCL 12.5 MG/5ML PO ELIX
12.5000 mg | ORAL_SOLUTION | ORAL | Status: DC | PRN
  Administered 2012-04-04 – 2012-04-08 (×8): 25 mg via ORAL
  Filled 2012-04-02 (×2): qty 10
  Filled 2012-04-02: qty 5
  Filled 2012-04-02: qty 10
  Filled 2012-04-02: qty 5
  Filled 2012-04-02: qty 10
  Filled 2012-04-02: qty 5
  Filled 2012-04-02: qty 10

## 2012-04-02 SURGICAL SUPPLY — 74 items
BAG DECANTER FOR FLEXI CONT (MISCELLANEOUS) ×2 IMPLANT
BANDAGE ELASTIC 4 VELCRO ST LF (GAUZE/BANDAGES/DRESSINGS) IMPLANT
BANDAGE ELASTIC 6 VELCRO ST LF (GAUZE/BANDAGES/DRESSINGS) IMPLANT
BANDAGE ESMARK 6X9 LF (GAUZE/BANDAGES/DRESSINGS) IMPLANT
BANDAGE GAUZE ELAST BULKY 4 IN (GAUZE/BANDAGES/DRESSINGS) IMPLANT
BIT DRILL 2.9 CANN QC NONSTRL (BIT) ×2 IMPLANT
BIT DRILL 3.5 (BIT) ×1
BIT DRILL 3.5MM (BIT) ×1 IMPLANT
BNDG COHESIVE 4X5 TAN STRL (GAUZE/BANDAGES/DRESSINGS) ×2 IMPLANT
BNDG ESMARK 6X9 LF (GAUZE/BANDAGES/DRESSINGS)
CLOTH BEACON ORANGE TIMEOUT ST (SAFETY) ×2 IMPLANT
COVER SURGICAL LIGHT HANDLE (MISCELLANEOUS) ×4 IMPLANT
CUFF TOURNIQUET SINGLE 18IN (TOURNIQUET CUFF) IMPLANT
CUFF TOURNIQUET SINGLE 24IN (TOURNIQUET CUFF) IMPLANT
CUFF TOURNIQUET SINGLE 34IN LL (TOURNIQUET CUFF) IMPLANT
CUFF TOURNIQUET SINGLE 44IN (TOURNIQUET CUFF) IMPLANT
DECANTER SPIKE VIAL GLASS SM (MISCELLANEOUS) IMPLANT
DRAPE C-ARM 42X72 X-RAY (DRAPES) ×2 IMPLANT
DRAPE INCISE IOBAN 66X45 STRL (DRAPES) IMPLANT
DRAPE OEC MINIVIEW 54X84 (DRAPES) IMPLANT
DRAPE X RAY CASS MED 25220 (DRAPES) IMPLANT
DRILL BIT 3.5MM (BIT) ×1
DRSG ADAPTIC 3X8 NADH LF (GAUZE/BANDAGES/DRESSINGS) IMPLANT
DRSG PAD ABDOMINAL 8X10 ST (GAUZE/BANDAGES/DRESSINGS) IMPLANT
DURAPREP 26ML APPLICATOR (WOUND CARE) ×2 IMPLANT
ELECT REM PT RETURN 9FT ADLT (ELECTROSURGICAL) ×2
ELECTRODE REM PT RTRN 9FT ADLT (ELECTROSURGICAL) ×1 IMPLANT
FACESHIELD LNG OPTICON STERILE (SAFETY) IMPLANT
GLOVE BIO SURGEON STRL SZ7 (GLOVE) ×4 IMPLANT
GLOVE BIO SURGEON STRL SZ7.5 (GLOVE) ×4 IMPLANT
GLOVE BIOGEL PI IND STRL 6.5 (GLOVE) ×1 IMPLANT
GLOVE BIOGEL PI IND STRL 7.0 (GLOVE) ×3 IMPLANT
GLOVE BIOGEL PI IND STRL 7.5 (GLOVE) ×3 IMPLANT
GLOVE BIOGEL PI INDICATOR 6.5 (GLOVE) ×1
GLOVE BIOGEL PI INDICATOR 7.0 (GLOVE) ×3
GLOVE BIOGEL PI INDICATOR 7.5 (GLOVE) ×3
GLOVE SURG SS PI 6.5 STRL IVOR (GLOVE) ×2 IMPLANT
GLOVE SURG SS PI 8.0 STRL IVOR (GLOVE) ×4 IMPLANT
GOWN BRE IMP SLV AUR LG STRL (GOWN DISPOSABLE) ×2 IMPLANT
GOWN EXTRA PROTECTION XL (GOWNS) ×2 IMPLANT
GOWN STRL NON-REIN LRG LVL3 (GOWN DISPOSABLE) ×12 IMPLANT
HANDPIECE INTERPULSE COAX TIP (DISPOSABLE) ×1
K-WIRE ACE 1.6X6 (WIRE) ×6
KIT BASIN OR (CUSTOM PROCEDURE TRAY) ×2 IMPLANT
KIT ROOM TURNOVER OR (KITS) ×2 IMPLANT
KWIRE ACE 1.6X6 (WIRE) ×3 IMPLANT
MANIFOLD NEPTUNE II (INSTRUMENTS) ×2 IMPLANT
NAIL FLEX WIN 3.0MM (Nail) ×2 IMPLANT
NS IRRIG 1000ML POUR BTL (IV SOLUTION) ×2 IMPLANT
PACK ORTHO EXTREMITY (CUSTOM PROCEDURE TRAY) ×2 IMPLANT
PAD ARMBOARD 7.5X6 YLW CONV (MISCELLANEOUS) ×2 IMPLANT
PAD CAST 4YDX4 CTTN HI CHSV (CAST SUPPLIES) IMPLANT
PADDING CAST COTTON 4X4 STRL (CAST SUPPLIES)
SCREW ACE CAN 4.0 42M (Screw) ×2 IMPLANT
SCREW ACE CAN 4.0 44M (Screw) ×2 IMPLANT
SET HNDPC FAN SPRY TIP SCT (DISPOSABLE) ×1 IMPLANT
SPLINT FIBERGLASS 3X35 (CAST SUPPLIES) ×4 IMPLANT
SPONGE GAUZE 4X4 12PLY (GAUZE/BANDAGES/DRESSINGS) IMPLANT
SPONGE LAP 18X18 X RAY DECT (DISPOSABLE) IMPLANT
SPONGE LAP 4X18 X RAY DECT (DISPOSABLE) ×4 IMPLANT
STAPLER VISISTAT 35W (STAPLE) IMPLANT
STOCKINETTE IMPERVIOUS 9X36 MD (GAUZE/BANDAGES/DRESSINGS) IMPLANT
SUCTION FRAZIER TIP 10 FR DISP (SUCTIONS) IMPLANT
SURGILUBE 3G PEEL PACK STRL (MISCELLANEOUS) ×2 IMPLANT
SUT VIC AB 1 CT1 27 (SUTURE) ×1
SUT VIC AB 1 CT1 27XBRD ANBCTR (SUTURE) ×1 IMPLANT
SUT VIC AB 2-0 CTB1 (SUTURE) ×4 IMPLANT
TOWEL OR 17X24 6PK STRL BLUE (TOWEL DISPOSABLE) ×2 IMPLANT
TOWEL OR 17X26 10 PK STRL BLUE (TOWEL DISPOSABLE) ×2 IMPLANT
TUBE ANAEROBIC SPECIMEN COL (MISCELLANEOUS) IMPLANT
TUBE CONNECTING 12X1/4 (SUCTIONS) ×2 IMPLANT
UNDERPAD 30X30 INCONTINENT (UNDERPADS AND DIAPERS) ×2 IMPLANT
WATER STERILE IRR 1000ML POUR (IV SOLUTION) IMPLANT
YANKAUER SUCT BULB TIP NO VENT (SUCTIONS) ×2 IMPLANT
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
## 2012-04-02 NOTE — Anesthesia Procedure Notes (Addendum)
Date/Time: 04/02/2012 7:08 AM Performed by: Jeani Hawking Pre-anesthesia Checklist: Patient identified, Emergency Drugs available, Suction available and Patient being monitored Patient Re-evaluated:Patient Re-evaluated prior to inductionOxygen Delivery Method: Simple face mask Preoxygenation: Pre-oxygenation with 100% oxygen Intubation Type: IV induction, Cricoid Pressure applied and Rapid sequence Laryngoscope Size: Mac and 3 Grade View: Grade I Tube type: Oral Tube size: 7.5 mm Number of attempts: 1 Airway Equipment and Method: Stylet (neck alignment maintained by MDA during DL c-collar loosened for DL then c-collar tightened) Placement Confirmation: ETT inserted through vocal cords under direct vision,  positive ETCO2 and breath sounds checked- equal and bilateral Secured at: 21 cm Tube secured with: Tape Dental Injury: Teeth and Oropharynx as per pre-operative assessment
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
## 2012-04-02 NOTE — Progress Notes (Signed)
I do not believe that trying to suture this laceration would be beneficial at this time.  To keep it clean with peroxide swish and swallow or chlorhexidine.  Marta Lamas. Gae Bon, MD, FACS [phone] Trauma Surgeon

## 2012-04-02 NOTE — ED Notes (Signed)
[employer] officer at bedside to speak with patient.

## 2012-04-02 NOTE — OR Nursing (Signed)
Dr wyatt here 
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
## 2012-04-02 NOTE — Brief Op Note (Addendum)
04/02/2012  10:00 AM  PATIENT:  Nichole Page  21 y.o. female  PRE-OPERATIVE DIAGNOSIS:  Ankle fracture [824.8]  POST-OPERATIVE DIAGNOSIS:  Ankle fracture  PROCEDURE:  Procedure(s) (LRB) with comments: OPEN REDUCTION INTERNAL FIXATION (ORIF) ANKLE FRACTURE (Left) IRRIGATION AND DEBRIDEMENT EXTREMITY (Left) ORIF fibula fracture, ORIF talus fracture  SURGEON:  Surgeon(s) and Role:    * Javier Docker, MD - Primary  PHYSICIAN ASSISTANT:   ASSISTANTS: Irish Elders   ANESTHESIA:   general  EBL:  Total I/O In: 2000 [I.V.:2000] Out: 50 [Blood:50]  BLOOD ADMINISTERED:none  DRAINS: none   LOCAL MEDICATIONS USED:  NONE  SPECIMEN:  No Specimen  DISPOSITION OF SPECIMEN:  N/A  COUNTS:  YES  TOURNIQUET:  * No tourniquets in log *  DICTATION: .Other Dictation: Dictation Number [id number] (missed rst of number)  PLAN OF CARE: Admit to inpatient   PATIENT DISPOSITION:  PACU - hemodynamically stable.   Delay start of Pharmacological VTE agent (>24hrs) due to surgical blood loss or risk of bleeding: no

## 2012-04-02 NOTE — Preoperative (Signed)
Beta Blockers   Reason not to administer Beta Blockers:Not Applicable 

## 2012-04-02 NOTE — Anesthesia Preprocedure Evaluation (Signed)
Anesthesia Evaluation  Patient identified by MRN, date of birth, ID band Patient awake    Reviewed: Allergy & Precautions, H&P , NPO status , Patient's Chart, lab work & pertinent test results  Airway Mallampati: I TM Distance: >3 FB Neck ROM: full    Dental   Pulmonary asthma ,          Cardiovascular hypertension, Rhythm:regular Rate:Normal     Neuro/Psych PSYCHIATRIC DISORDERS Bipolar Disorder    GI/Hepatic   Endo/Other    Renal/GU      Musculoskeletal   Abdominal   Peds  Hematology   Anesthesia Other Findings   Reproductive/Obstetrics                           Anesthesia Physical Anesthesia Plan  ASA: II  Anesthesia Plan: General   Post-op Pain Management:    Induction: Intravenous  Airway Management Planned: Oral ETT  Additional Equipment:   Intra-op Plan:   Post-operative Plan: Extubation in OR  Informed Consent: I have reviewed the patients History and Physical, chart, labs and discussed the procedure including the risks, benefits and alternatives for the proposed anesthesia with the patient or authorized representative who has indicated his/her understanding and acceptance.     Plan Discussed with: CRNA, Anesthesiologist and Surgeon  Anesthesia Plan Comments:         Anesthesia Quick Evaluation
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
## 2012-04-02 NOTE — Progress Notes (Signed)
Pt. Stated she needed to urinate.  She attempted a couple of times to use the bedpan but was unsuccessful.  She consented to inserting the foley.  Per Dr. Arlington Calix order, a 16 fr. Foley catheter per sterile technique with the assistance of Bayard Hugger RN.  Pt. Tolerated well.

## 2012-04-02 NOTE — ED Notes (Signed)
Tried x 4 for foley catheter insertion.   Unsuccessful attempts x 3 RNs and 1 EMT.

## 2012-04-02 NOTE — ED Notes (Signed)
Patient returned from CT via stretcher with Silva Bandy, RN
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
## 2012-04-02 NOTE — ED Notes (Signed)
Patient taken to CT scanner with Silva Bandy, Charity fundraiser.

## 2012-04-02 NOTE — Progress Notes (Signed)
ANTIBIOTIC CONSULT NOTE - INITIAL  Pharmacy Consult for gentamicin Indication: Empiric antibiotics for open left ankle fracture  Allergies  Allergen Reactions  . Apple Juice Nausea And Vomiting and Other (See Comments)    Eyes swell    Patient Measurements: Height: 5\' 6"  (167.6 cm) Weight: 230 lb (104.327 kg) IBW/kg (Calculated) : 59.3  Adjusted Body Weight: 77kg Vital Signs: Temp: 99.4 F (37.4 C) (11/27 1330) Temp src: Oral (11/27 0418) BP: 112/65 mmHg (11/27 1330) Pulse Rate: 119  (11/27 1330) Intake/Output from previous day:   Intake/Output from this shift: Total I/O In: 2000 [I.V.:2000] Out: 50 [Blood:50]  Labs:  [facility] 04/02/12 0429 04/02/12 0420  WBC -- 10.9*  HGB 13.6 12.5  PLT -- 466*  LABCREA -- --  CREATININE 1.10 0.57   Estimated Creatinine Clearance: 97.9 ml/min (by C-G formula based on Cr of 1.1). No results found for this basename: VANCOTROUGH:2,VANCOPEAK:2,VANCORANDOM:2,GENTTROUGH:2,GENTPEAK:2,GENTRANDOM:2,TOBRATROUGH:2,TOBRAPEAK:2,TOBRARND:2,AMIKACINPEAK:2,AMIKACINTROU:2,AMIKACIN:2, in the last 72 hours   Microbiology: No results found for this or any previous visit (from the past 720 hour(s)).  Medical History: Past Medical History  Diagnosis Date  . Bipolar 1 disorder   . STI (sexually transmitted infection)    Assessment: 21 year old female s/p orif for left open ankle fracture. Patient given one dose of gent prior to surgery, orders to resume dosing post-op. Given excellent renal function, will use extended interval dosing based on an adjusted body weight.  Goal of Therapy:  Gentamicin trough level <2 mcg/ml Gentamicin Peak level 6-8  Plan:  Gentamicin 540mg  IV q24 hours Check 10 hour random gent level - adjust dosing according to nomogram  Severiano Gilbert 04/02/2012,1:41 PM

## 2012-04-02 NOTE — ED Notes (Signed)
Patient belongings placed in belongings bag and taken to OR.  Handoff in OR holding.
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
## 2012-04-02 NOTE — Transfer of Care (Signed)
Immediate Anesthesia Transfer of Care Note  Patient: Nichole Page  Procedure(s) Performed: Procedure(s) (LRB) with comments: OPEN REDUCTION INTERNAL FIXATION (ORIF) ANKLE FRACTURE (Left) IRRIGATION AND DEBRIDEMENT EXTREMITY (Left)  Patient Location: PACU  Anesthesia Type:General  Level of Consciousness: awake, alert  and oriented  Airway & Oxygen Therapy: Patient Spontanous Breathing and Patient connected to nasal cannula oxygen  Post-op Assessment: Report given to PACU RN and Post -op Vital signs reviewed and stable  Post vital signs: Reviewed and stable  Complications: No apparent anesthesia complications

## 2012-04-02 NOTE — ED Notes (Signed)
IV started 18 G R AC by [employer], RN
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
## 2012-04-03 ENCOUNTER — Ambulatory Visit (HOSPITAL_COMMUNITY): Payer: BC Managed Care – PPO

## 2012-04-03 LAB — URINE CULTURE
Colony Count: NO GROWTH
Culture: NO GROWTH

## 2012-04-03 LAB — CBC
HCT: 29.3 % — ABNORMAL LOW (ref 36.0–46.0)
Hemoglobin: 10.1 g/dL — ABNORMAL LOW (ref 12.0–15.0)
MCH: 31 pg (ref 26.0–34.0)
MCHC: 34.5 g/dL (ref 30.0–36.0)
MCV: 89.9 fL (ref 78.0–100.0)
Platelets: 351 10*3/uL (ref 150–400)
RBC: 3.26 MIL/uL — ABNORMAL LOW (ref 3.87–5.11)
RDW: 13.5 % (ref 11.5–15.5)
WBC: 12.3 10*3/uL — ABNORMAL HIGH (ref 4.0–10.5)

## 2012-04-03 LAB — BASIC METABOLIC PANEL
BUN: 7 mg/dL (ref 6–23)
CO2: 26 mEq/L (ref 19–32)
Calcium: 8.6 mg/dL (ref 8.4–10.5)
Chloride: 102 mEq/L (ref 96–112)
Creatinine, Ser: 0.58 mg/dL (ref 0.50–1.10)
GFR calc Af Amer: >90 mL/min (ref >90)
GFR calc non Af Amer: >90 mL/min (ref >90)
Glucose, Bld: 112 mg/dL — ABNORMAL HIGH (ref 70–99)
Potassium: 3.9 mEq/L (ref 3.5–5.1)
Sodium: 136 mEq/L (ref 135–145)

## 2012-04-03 LAB — CDS SEROLOGY
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
## 2012-04-03 NOTE — Progress Notes (Addendum)
Orthopedics Progress Note  Subjective: Pt c/o moderate pain to left foot/ankle s/p surgery yesterday Otherwise doing ok  Objective:  Filed Vitals:   04/03/12 0611  BP: 110/67  Pulse: 87  Temp: 98.9 F (37.2 C)  Resp: 18    General: Awake and alert  Musculoskeletal: left foot/ankle in splint, nv intact distally Neurovascularly intact  Lab Results  Component Value Date   WBC 12.3* 04/03/2012   HGB 10.1* 04/03/2012   HCT 29.3* 04/03/2012   MCV 89.9 04/03/2012   PLT 351 04/03/2012       Component Value Date/Time   NA 136 04/03/2012 0215   K 3.9 04/03/2012 0215   CL 102 04/03/2012 0215   CO2 26 04/03/2012 0215   GLUCOSE 112* 04/03/2012 0215   BUN 7 04/03/2012 0215   CREATININE 0.58 04/03/2012 0215   CALCIUM 8.6 04/03/2012 0215   GFRNONAA >90 04/03/2012 0215   GFRAA >90 04/03/2012 0215    Lab Results  Component Value Date   INR 0.92 04/02/2012    Assessment/Plan: POD #1 s/p Procedure(s):left OPEN REDUCTION INTERNAL FIXATION (ORIF) ANKLE FRACTURE IRRIGATION AND DEBRIDEMENT EXTREMITY  PT/OT Non weight bearing left lower leg Pain control  Viviann Spare R. Ranell Patrick, MD 04/03/2012 9:05 AM    Plan repeat I&D tomorrow and EUA.

## 2012-04-03 NOTE — Progress Notes (Signed)
ANTIBIOTIC CONSULT NOTE - follow up  Pharmacy Consult for gentamicin Indication: Empiric antibiotics for open left ankle fracture   Patient Measurements: Height: 5\' 6"  (167.6 cm) Weight: 230 lb (104.327 kg) IBW/kg (Calculated) : 59.3  Adjusted Body Weight: 77kg Vital Signs: Temp: 98.9 F (37.2 C) (11/28 0611) BP: 110/67 mmHg (11/28 0611) Pulse Rate: 87  (11/28 0611) Intake/Output from previous day: 11/27 0701 - 11/28 0700 In: 3560 [P.O.:360; I.V.:3200] Out: 1250 [Urine:1200; Blood:50] Intake/Output from this shift:    Labs:  Basename 04/03/12 0215 04/02/12 0429 04/02/12 0420  WBC 12.3* -- 10.9*  HGB 10.1* 13.6 12.5  PLT 351 -- 466*  LABCREA -- -- --  CREATININE 0.58 1.10 0.57   Estimated Creatinine Clearance: 134.6 ml/min (by C-G formula based on Cr of 0.58).  Basename 04/03/12 0215  VANCOTROUGH --  Leodis Binet --  VANCORANDOM --  GENTTROUGH --  GENTPEAK --  GENTRANDOM 1.2  TOBRATROUGH --  TOBRAPEAK --  TOBRARND --  AMIKACINPEAK --  AMIKACINTROU --  AMIKACIN --     Microbiology: No results found for this or any previous visit (from the past 720 hour(s)).  Medical History: Past Medical History  Diagnosis Date  . Bipolar 1 disorder   . STI (sexually transmitted infection)    Assessment: 21 year old female s/p orif for left open ankle fracture. Patient given one dose of gent prior to surgery, orders to resume dosing post-op. Given excellent renal function, will use extended interval dosing based on an adjusted body weight.  10 hour gentamicin level resulted at 1.2 which is within desired trough range. No changes in renal function noted. No fevers noted and wbc up slightly to 12.  Plan:  Continue gentamicin 540mg  q24 hours Recheck trough as needed.   Goal of Therapy:  Gentamicin trough level <2 mcg/ml Gentamicin Peak level 6-8  Severiano Gilbert 04/03/2012,7:16 AM

## 2012-04-04 ENCOUNTER — Encounter (HOSPITAL_COMMUNITY): Admission: EM | Disposition: A | Discharge status: Home / Self Care | Payer: Self-pay | Source: Home / Self Care

## 2012-04-04 ENCOUNTER — Ambulatory Visit (HOSPITAL_COMMUNITY): Payer: BC Managed Care – PPO

## 2012-04-04 ENCOUNTER — Encounter (HOSPITAL_COMMUNITY): Payer: Self-pay | Admitting: Critical Care Medicine

## 2012-04-04 ENCOUNTER — Ambulatory Visit (HOSPITAL_COMMUNITY): Payer: BC Managed Care – PPO | Admitting: Critical Care Medicine

## 2012-04-04 DIAGNOSIS — D62 Acute posthemorrhagic anemia: Secondary | ICD-10-CM

## 2012-04-04 HISTORY — PX: I&D EXTREMITY: SHX5045

## 2012-04-04 LAB — BASIC METABOLIC PANEL
BUN: 7 mg/dL (ref 6–23)
Calcium: 9 mg/dL (ref 8.4–10.5)
Creatinine, Ser: 0.64 mg/dL (ref 0.50–1.10)
GFR calc Af Amer: >90 mL/min (ref >90)
GFR calc non Af Amer: >90 mL/min (ref >90)

## 2012-04-04 LAB — CBC
MCHC: 34.4 g/dL (ref 30.0–36.0)
RDW: 13.5 % (ref 11.5–15.5)

## 2012-04-04 SURGERY — IRRIGATION AND DEBRIDEMENT EXTREMITY
Anesthesia: General | Site: Ankle | Laterality: Left | Wound class: Clean Contaminated

## 2012-04-04 MED ORDER — FENTANYL CITRATE 0.05 MG/ML IJ SOLN
100.0000 ug | Freq: Once | INTRAMUSCULAR | Status: AC
  Administered 2012-04-04: 100 ug via INTRAVENOUS

## 2012-04-04 MED ORDER — PROPOFOL 10 MG/ML IV BOLUS
INTRAVENOUS | Status: DC | PRN
  Administered 2012-04-04: 200 mg via INTRAVENOUS

## 2012-04-04 MED ORDER — BUPIVACAINE HCL (PF) 0.5 % IJ SOLN
INTRAMUSCULAR | Status: DC | PRN
  Administered 2012-04-04: 30 mL

## 2012-04-04 MED ORDER — LACTATED RINGERS IV SOLN
INTRAVENOUS | Status: DC
  Administered 2012-04-04 (×2): via INTRAVENOUS

## 2012-04-04 MED ORDER — ONDANSETRON HCL 4 MG/2ML IJ SOLN
INTRAMUSCULAR | Status: DC | PRN
  Administered 2012-04-04: 4 mg via INTRAVENOUS

## 2012-04-04 MED ORDER — MIDAZOLAM HCL 5 MG/5ML IJ SOLN
INTRAMUSCULAR | Status: DC | PRN
  Administered 2012-04-04: 1 mg via INTRAVENOUS

## 2012-04-04 MED ORDER — FENTANYL CITRATE 0.05 MG/ML IJ SOLN
INTRAMUSCULAR | Status: DC | PRN
  Administered 2012-04-04 (×6): 25 ug via INTRAVENOUS

## 2012-04-04 MED ORDER — ONDANSETRON HCL 4 MG/2ML IJ SOLN: 4.0000 mg | Freq: Once | INTRAMUSCULAR | Status: DC | PRN

## 2012-04-04 MED ORDER — DEXAMETHASONE SODIUM PHOSPHATE 4 MG/ML IJ SOLN
INTRAMUSCULAR | Status: DC | PRN
  Administered 2012-04-04: 4 mg via INTRAVENOUS

## 2012-04-04 MED ORDER — LIDOCAINE HCL (CARDIAC) 20 MG/ML IV SOLN
INTRAVENOUS | Status: DC | PRN
  Administered 2012-04-04: 50 mg via INTRAVENOUS

## 2012-04-04 MED ORDER — SODIUM CHLORIDE 0.9 % IR SOLN
Status: DC | PRN
  Administered 2012-04-04: 10:00:00

## 2012-04-04 MED ORDER — ENOXAPARIN SODIUM 40 MG/0.4ML ~~LOC~~ SOLN
40.0000 mg | SUBCUTANEOUS | Status: DC
  Administered 2012-04-05 – 2012-04-08 (×4): 40 mg via SUBCUTANEOUS
  Filled 2012-04-04 (×4): qty 0.4

## 2012-04-04 MED ORDER — SODIUM CHLORIDE 0.9 % IR SOLN
Status: DC | PRN
  Administered 2012-04-04 (×2): 3000 mL

## 2012-04-04 MED ORDER — 0.9 % SODIUM CHLORIDE (POUR BTL) OPTIME
TOPICAL | Status: DC | PRN
  Administered 2012-04-04: 1000 mL

## 2012-04-04 MED ORDER — PHENYLEPHRINE HCL 10 MG/ML IJ SOLN
INTRAMUSCULAR | Status: DC | PRN
  Administered 2012-04-04 (×2): 80 ug via INTRAVENOUS

## 2012-04-04 MED ORDER — HYDROMORPHONE HCL PF 1 MG/ML IJ SOLN
0.2500 mg | INTRAMUSCULAR | Status: DC | PRN
  Administered 2012-04-04 (×3): 0.5 mg via INTRAVENOUS

## 2012-04-04 SURGICAL SUPPLY — 41 items
BAG DECANTER FOR FLEXI CONT (MISCELLANEOUS) ×2 IMPLANT
BANDAGE ELASTIC 4 VELCRO ST LF (GAUZE/BANDAGES/DRESSINGS) ×2 IMPLANT
BANDAGE ELASTIC 6 VELCRO ST LF (GAUZE/BANDAGES/DRESSINGS) ×2 IMPLANT
BANDAGE GAUZE ELAST BULKY 4 IN (GAUZE/BANDAGES/DRESSINGS) ×2 IMPLANT
BNDG COHESIVE 4X5 TAN STRL (GAUZE/BANDAGES/DRESSINGS) ×2 IMPLANT
CLOTH BEACON ORANGE TIMEOUT ST (SAFETY) ×2 IMPLANT
CO AXIAL FAN SPRAY TIP SOFT SH (MISCELLANEOUS) ×2 IMPLANT
DRSG ADAPTIC 3X8 NADH LF (GAUZE/BANDAGES/DRESSINGS) ×2 IMPLANT
DRSG PAD ABDOMINAL 8X10 ST (GAUZE/BANDAGES/DRESSINGS) ×4 IMPLANT
ELECT REM PT RETURN 9FT ADLT (ELECTROSURGICAL) ×2
ELECTRODE REM PT RTRN 9FT ADLT (ELECTROSURGICAL) ×1 IMPLANT
GLOVE SURG SS PI 8.0 STRL IVOR (GLOVE) ×4 IMPLANT
GOWN EXTRA PROTECTION XL (GOWNS) ×2 IMPLANT
GOWN STRL NON-REIN LRG LVL3 (GOWN DISPOSABLE) ×2 IMPLANT
HANDPIECE INTERPULSE COAX TIP (DISPOSABLE) ×1
KIT BASIN OR (CUSTOM PROCEDURE TRAY) ×2 IMPLANT
KIT ROOM TURNOVER OR (KITS) ×2 IMPLANT
MANIFOLD NEPTUNE II (INSTRUMENTS) ×2 IMPLANT
NEEDLE 22X1 1/2 (OR ONLY) (NEEDLE) ×2 IMPLANT
NS IRRIG 1000ML POUR BTL (IV SOLUTION) ×2 IMPLANT
PACK ORTHO EXTREMITY (CUSTOM PROCEDURE TRAY) ×2 IMPLANT
PAD ARMBOARD 7.5X6 YLW CONV (MISCELLANEOUS) ×4 IMPLANT
PAD CAST 4YDX4 CTTN HI CHSV (CAST SUPPLIES) ×1 IMPLANT
PADDING CAST COTTON 4X4 STRL (CAST SUPPLIES) ×1
SET HNDPC FAN SPRY TIP SCT (DISPOSABLE) ×1 IMPLANT
SPLINT FIBERGLASS 3X35 (CAST SUPPLIES) ×4 IMPLANT
SPONGE GAUZE 4X4 12PLY (GAUZE/BANDAGES/DRESSINGS) ×2 IMPLANT
SPONGE LAP 18X18 X RAY DECT (DISPOSABLE) ×2 IMPLANT
SPONGE LAP 4X18 X RAY DECT (DISPOSABLE) ×2 IMPLANT
STAPLER VISISTAT (STAPLE) ×2 IMPLANT
STOCKINETTE IMPERVIOUS 9X36 MD (GAUZE/BANDAGES/DRESSINGS) ×2 IMPLANT
SUT ETHILON 4 0 PS 2 18 (SUTURE) ×2 IMPLANT
SUT VIC AB 1 CT1 27 (SUTURE) ×1
SUT VIC AB 1 CT1 27XBRD ANBCTR (SUTURE) ×1 IMPLANT
SUT VIC AB 2-0 CTB1 (SUTURE) ×4 IMPLANT
SYR CONTROL 10ML LL (SYRINGE) ×2 IMPLANT
TOWEL OR 17X24 6PK STRL BLUE (TOWEL DISPOSABLE) IMPLANT
TOWEL OR 17X26 10 PK STRL BLUE (TOWEL DISPOSABLE) ×2 IMPLANT
TUBE CONNECTING 12X1/4 (SUCTIONS) ×2 IMPLANT
UNDERPAD 30X30 INCONTINENT (UNDERPADS AND DIAPERS) ×2 IMPLANT
YANKAUER SUCT BULB TIP NO VENT (SUCTIONS) ×2 IMPLANT

## 2012-04-04 NOTE — Anesthesia Preprocedure Evaluation (Addendum)
Anesthesia Evaluation  Patient identified by MRN, date of birth, ID band Patient awake    Reviewed: Allergy & Precautions, H&P , NPO status , Patient's Chart, lab work & pertinent test results  Airway Mallampati: I TM Distance: >3 FB Neck ROM: full    Dental  (+) Dental Advisory Given   Pulmonary asthma ,          Cardiovascular Rhythm:regular Rate:Normal     Neuro/Psych PSYCHIATRIC DISORDERS Bipolar Disorder    GI/Hepatic   Endo/Other    Renal/GU      Musculoskeletal   Abdominal   Peds  Hematology   Anesthesia Other Findings   Reproductive/Obstetrics                          Anesthesia Physical Anesthesia Plan  ASA: I  Anesthesia Plan: General   Post-op Pain Management:    Induction: Intravenous  Airway Management Planned: LMA  Additional Equipment:   Intra-op Plan:   Post-operative Plan: Extubation in OR  Informed Consent: I have reviewed the patients History and Physical, chart, labs and discussed the procedure including the risks, benefits and alternatives for the proposed anesthesia with the patient or authorized representative who has indicated his/her understanding and acceptance.   Dental advisory given  Plan Discussed with: Anesthesiologist and Surgeon  Anesthesia Plan Comments:         Anesthesia Quick Evaluation

## 2012-04-04 NOTE — Brief Op Note (Signed)
04/02/2012 - 04/04/2012  10:27 AM  PATIENT:  Nichole Page  21 y.o. female  PRE-OPERATIVE DIAGNOSIS:  Left Ankle fracture  POST-OPERATIVE DIAGNOSIS:  Left Ankle fracture  PROCEDURE:  Procedure(s) (LRB) with comments: IRRIGATION AND DEBRIDEMENT EXTREMITY (Left) EXAM UNDER ANESTHESIA () revision of ORIF talus  SURGEON:  Surgeon(s) and Role:    * Javier Docker, MD - Primary  PHYSICIAN ASSISTANT:   ASSISTANTS: none   ANESTHESIA:   general  EBL:  Total I/O In: 1000 [I.V.:1000] Out: 1010 [Urine:1000; Blood:10]  BLOOD ADMINISTERED:none  DRAINS: none   LOCAL MEDICATIONS USED:  MARCAINE     SPECIMEN:  No Specimen  DISPOSITION OF SPECIMEN:  N/A  COUNTS:  YES  TOURNIQUET:  * No tourniquets in log *  DICTATION: .Other Dictation: Dictation Number     [id number]  PLAN OF CARE: Admit to inpatient   PATIENT DISPOSITION:  PACU - hemodynamically stable.   Delay start of Pharmacological VTE agent (>24hrs) due to surgical blood loss or risk of bleeding: no

## 2012-04-04 NOTE — Progress Notes (Signed)
UR completed 
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
## 2012-04-04 NOTE — Preoperative (Signed)
Beta Blockers   Reason not to administer Beta Blockers:Not Applicable 

## 2012-04-04 NOTE — Progress Notes (Signed)
PT Cancellation Note  Patient Details Name: Nichole Page MRN: [id number] DOB: Aug 06, 1990   Cancelled Treatment:    Reason Eval/Treat Not Completed: Other (comment) (Pt had surgery today.  )   Alferd Apa 04/04/2012, 4:36 PM Tawona Filsinger L. Sriman Tally DPT [phone]
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
## 2012-04-04 NOTE — Transfer of Care (Signed)
Immediate Anesthesia Transfer of Care Note  Patient: Nichole Page  Procedure(s) Performed: Procedure(s) (LRB) with comments: IRRIGATION AND DEBRIDEMENT EXTREMITY (Left) EXAM UNDER ANESTHESIA ()  Patient Location: PACU  Anesthesia Type:General  Level of Consciousness: awake, alert  and oriented  Airway & Oxygen Therapy: Patient Spontanous Breathing and Patient connected to nasal cannula oxygen  Post-op Assessment: Report given to PACU RN, Post -op Vital signs reviewed and stable and Patient moving all extremities X 4  Post vital signs: Reviewed and stable  Complications: No apparent anesthesia complications

## 2012-04-04 NOTE — Anesthesia Postprocedure Evaluation (Signed)
  Anesthesia Post-op Note  Patient: Nichole Page  Procedure(s) Performed: Procedure(s) (LRB) with comments: IRRIGATION AND DEBRIDEMENT EXTREMITY (Left)  Patient Location: PACU  Anesthesia Type:General  Level of Consciousness: awake, alert , oriented and patient cooperative  Airway and Oxygen Therapy: Patient Spontanous Breathing  Post-op Pain: mild  Post-op Assessment: Post-op Vital signs reviewed, Patient's Cardiovascular Status Stable, Respiratory Function Stable, Patent Airway, No signs of Nausea or vomiting and Pain level controlled  Post-op Vital Signs: stable  Complications: No apparent anesthesia complications

## 2012-04-04 NOTE — Progress Notes (Signed)
agree
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
## 2012-04-05 ENCOUNTER — Encounter (HOSPITAL_COMMUNITY): Payer: Self-pay | Admitting: Orthopedic Surgery

## 2012-04-05 LAB — BASIC METABOLIC PANEL
BUN: 10 mg/dL (ref 6–23)
GFR calc non Af Amer: >90 mL/min (ref >90)
Glucose, Bld: 147 mg/dL — ABNORMAL HIGH (ref 70–99)
Potassium: 3.9 mEq/L (ref 3.5–5.1)

## 2012-04-05 NOTE — Progress Notes (Signed)
Orthopedics Progress Note  Subjective: Pt doing better today Mild pain to left ankle s/p surgery yesterday Denies any new symptoms or issues  Objective:  Filed Vitals:   04/05/12 0632  BP: 124/59  Pulse: 79  Temp: 99.2 F (37.3 C)  Resp: 18    General: Awake and alert  Musculoskeletal: left ankle in splint, nv intact distally Neurovascularly intact  Lab Results  Component Value Date   WBC 11.6* 04/04/2012   HGB 10.3* 04/04/2012   HCT 29.9* 04/04/2012   MCV 92.3 04/04/2012   PLT 310 04/04/2012       Component Value Date/Time   NA 137 04/05/2012 0500   K 3.9 04/05/2012 0500   CL 101 04/05/2012 0500   CO2 30 04/05/2012 0500   GLUCOSE 147* 04/05/2012 0500   BUN 10 04/05/2012 0500   CREATININE 0.55 04/05/2012 0500   CALCIUM 9.0 04/05/2012 0500   GFRNONAA >90 04/05/2012 0500   GFRAA >90 04/05/2012 0500    Lab Results  Component Value Date   INR 0.92 04/02/2012    Assessment/Plan: POD #1 s/p Procedure(s):left IRRIGATION AND DEBRIDEMENT EXTREMITY  Continue current treatment with antibiotics Non weight bearing left lower leg Pain control as needed  Viviann Spare R. Ranell Patrick, MD 04/05/2012 8:44 AM
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
## 2012-04-05 NOTE — Anesthesia Postprocedure Evaluation (Signed)
Anesthesia Post Note  Patient: Nichole Page  Procedure(s) Performed: Procedure(s) (LRB): OPEN REDUCTION INTERNAL FIXATION (ORIF) ANKLE FRACTURE (Left) IRRIGATION AND DEBRIDEMENT EXTREMITY (Left)  Anesthesia type: General  Patient location: PACU  Post pain: Pain level controlled and Adequate analgesia  Post assessment: Post-op Vital signs reviewed, Patient's Cardiovascular Status Stable, Respiratory Function Stable, Patent Airway and Pain level controlled  Last Vitals:  Filed Vitals:   04/05/12 0632  BP: 124/59  Pulse: 79  Temp: 37.3 C  Resp: 18    Post vital signs: Reviewed and stable  Level of consciousness: awake, alert  and oriented  Complications: No apparent anesthesia complications

## 2012-04-05 NOTE — Progress Notes (Signed)
Still requiring a good amount of pain medication L toes warm and sensate Noted plan for return to OR by ortho tomorrow Patient examined and I agree with the assessment and plan  Violeta Gelinas, MD, MPH, FACS Pager: [phone]  04/05/2012 2:36 PM
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
## 2012-04-06 NOTE — Progress Notes (Signed)
Pt NPO awaiting possible return to OR.  It was determined at 1300 that pt not returning to OR today and diet orders upgraded.  PT checked on pt at 1325.  She is still awaiting a lunch tray and requesting hold PT until tomorrow.  She reports she has been getting out of bed to the [facility] and her mom will assist her to the recliner this PM.  PT to f/u tomorrow.  Aida Raider, PT  Office # [phone] Pager [phone]

## 2012-04-06 NOTE — Progress Notes (Signed)
OT Cancellation Note  Patient Details Name: Nichole Page MRN: [id number] DOB: [birth year]   Cancelled Treatment:    Reason Eval/Treat Not Completed: Other (comment) (refusal). Pt NPO awaiting possible return to OR. It was determined at 1300 that pt not returning to OR today and diet orders upgraded. Pt refusing to participate in therapy this afternoon.  Will re-attempt next date.  04/06/2012 Cipriano Mile OTR/L Pager [phone] Office [phone]

## 2012-04-06 NOTE — Progress Notes (Signed)
Subjective: 2 Days Post-Op Procedure(s) (LRB): IRRIGATION AND DEBRIDEMENT EXTREMITY (Left) Patient reports pain as mild.  Pain well controlled.  Per Dr. Shelle Iron, no plan for OR today.  Objective: Vital signs in last 24 hours: Temp:  [98.4 F (36.9 C)-99 F (37.2 C)] 98.6 F (37 C) (12/01 4098) Pulse Rate:  [69-79] 69  (12/01 0613) Resp:  [18] 18  (12/01 0613) BP: (113-129)/(60-78) 128/78 mmHg (12/01 0613) SpO2:  [100 %] 100 % (12/01 1191)  Intake/Output from previous day: 11/30 0701 - 12/01 0700 In: 1680 [P.O.:480; I.V.:1200] Out: 500 [Urine:500] Intake/Output this shift:     Basename 04/04/12 0618  HGB 10.3*    Basename 04/04/12 0618  WBC 11.6*  RBC 3.24*  HCT 29.9*  PLT 310    Basename 04/05/12 0500 04/04/12 0618  NA 137 133*  K 3.9 3.8  CL 101 101  CO2 30 27  BUN 10 7  CREATININE 0.55 0.64  GLUCOSE 147* 102*  CALCIUM 9.0 9.0   No results found for this basename: LABPT:2,INR:2 in the last 72 hours  Splint intact.  Brisk cap refill at toes.  Sens to LT intact dorsally and plantarly.  Assessment/Plan: 2 Days Post-Op Procedure(s) (LRB): IRRIGATION AND DEBRIDEMENT EXTREMITY (Left) Up with therapy  No plan for OR today.  Will restart diet.  Nichole Page 04/06/2012, 12:32 PM

## 2012-04-06 NOTE — Progress Notes (Signed)
Seen and agree  Salem Mastrogiovanni M. Nakeda Lebron, MD, FACS General, Bariatric, & Minimally Invasive Surgery [facility]  
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
## 2012-04-07 ENCOUNTER — Encounter (HOSPITAL_COMMUNITY): Payer: Self-pay | Admitting: Specialist

## 2012-04-07 LAB — CBC
HCT: 33.3 % — ABNORMAL LOW (ref 36.0–46.0)
Hemoglobin: 11.1 g/dL — ABNORMAL LOW (ref 12.0–15.0)
MCH: 30.3 pg (ref 26.0–34.0)
MCHC: 33.3 g/dL (ref 30.0–36.0)
MCV: 91 fL (ref 78.0–100.0)
RBC: 3.66 MIL/uL — ABNORMAL LOW (ref 3.87–5.11)

## 2012-04-07 LAB — URINALYSIS, ROUTINE W REFLEX MICROSCOPIC
Bilirubin Urine: NEGATIVE
Glucose, UA: NEGATIVE mg/dL
Ketones, ur: NEGATIVE mg/dL
Protein, ur: NEGATIVE mg/dL
pH: 5 (ref 5.0–8.0)

## 2012-04-07 LAB — URINE MICROSCOPIC-ADD ON

## 2012-04-07 MED ORDER — ENOXAPARIN SODIUM 40 MG/0.4ML ~~LOC~~ SOLN: 40.0000 mg | SUBCUTANEOUS | Status: DC

## 2012-04-07 MED ORDER — BACITRACIN ZINC 500 UNIT/GM EX OINT
TOPICAL_OINTMENT | Freq: Two times a day (BID) | CUTANEOUS | Status: DC
  Administered 2012-04-07: 1 via TOPICAL
  Administered 2012-04-07 – 2012-04-08 (×2): via TOPICAL
  Filled 2012-04-07: qty 15

## 2012-04-07 MED ORDER — BISACODYL 10 MG RE SUPP
10.0000 mg | Freq: Once | RECTAL | Status: AC
  Administered 2012-04-07: 10 mg via RECTAL

## 2012-04-07 MED ORDER — OXYCODONE-ACETAMINOPHEN 5-325 MG PO TABS: 1.0000 | ORAL_TABLET | ORAL | Status: DC | PRN

## 2012-04-07 MED ORDER — METHOCARBAMOL 500 MG PO TABS: 500.0000 mg | ORAL_TABLET | Freq: Four times a day (QID) | ORAL | Status: DC | PRN

## 2012-04-07 MED ORDER — CIPROFLOXACIN HCL 500 MG PO TABS
500.0000 mg | ORAL_TABLET | Freq: Two times a day (BID) | ORAL | Status: DC
  Administered 2012-04-07 – 2012-04-08 (×2): 500 mg via ORAL
  Filled 2012-04-07 (×4): qty 1

## 2012-04-07 NOTE — Progress Notes (Signed)
Patient is having some dysuria currently, and possibly a rash from her catheter.  Will check UA with micro and urine culture.  Still have not gotten in contact with Dr. Shelle Iron about plans for further treatment.  This patient has been seen and I agree with the findings and treatment plan.  Nichole Page. Gae Bon, MD, FACS [phone] (pager) [phone] (direct pager) Trauma Surgeon
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
## 2012-04-07 NOTE — Progress Notes (Addendum)
Subjective: 3 Days Post-Op Procedure(s) (LRB): IRRIGATION AND DEBRIDEMENT EXTREMITY (Left) Patient reports pain as moderate.    Objective: Vital signs in last 24 hours: Temp:  [98.6 F (37 C)-101.4 F (38.6 C)] 101.4 F (38.6 C) (12/02 0533) Pulse Rate:  [85-89] 87  (12/02 0533) Resp:  [18] 18  (12/02 0533) BP: (105-142)/(53-73) 120/62 mmHg (12/02 0533) SpO2:  [98 %-100 %] 100 % (12/02 0533)  Intake/Output from previous day: 12/01 0701 - 12/02 0700 In: 1400 [P.O.:400; I.V.:1000] Out: 900 [Urine:900] Intake/Output this shift:    No results found for this basename: HGB:5 in the last 72 hours No results found for this basename: WBC:2,RBC:2,HCT:2,PLT:2 in the last 72 hours  Basename 04/05/12 0500  NA 137  K 3.9  CL 101  CO2 30  BUN 10  CREATININE 0.55  GLUCOSE 147*  CALCIUM 9.0   No results found for this basename: LABPT:2,INR:2 in the last 72 hours  Neurologically intact Neurovascular intact Sensation intact distally Compartment soft  Assessment/Plan: 3 Days Post-Op Procedure(s) (LRB): IRRIGATION AND DEBRIDEMENT EXTREMITY (Left) Advance diet Discharge home with home health Encouraged incentive spirometry Reviewed D/C instructions with pt. Remain NWB in splint LLE. Lovenox x 10 days for DVT ppx then switch to ASA daily. Follow up in 10 days with Dr. Shelle Iron.   Andrez Grime M. 04/07/2012, 8:44 AM  Discussed with Dr. Victorino Dike and with patient and family. May need revision of ORIF or fusion if talus does not survive or develops an infection.. Now the soft tissues need to heal.   Also discussed temp elevation with Dr. Lindie Spruce. Probable UTI. Will take down dressing if necessary. Hold D/C to observe.
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
## 2012-04-07 NOTE — Progress Notes (Signed)
Physical Therapy Treatment Patient Details Name: Nichole Page MRN: [id number] DOB: 01/19/1991 Today's Date: 04/07/2012 Time: 4098-1191 PT Time Calculation (min): 23 min  PT Assessment / Plan / Recommendation Comments on Treatment Session  Progressing well with mobility and eduvated on use of WC. Recommend patient have WC in roder to mobilize and get inside her aunts house at DC.     Follow Up Recommendations        Does the patient have the potential to tolerate intense rehabilitation     Barriers to Discharge        Equipment Recommendations  Wheelchair cushion (measurements);Wheelchair (measurements);Rolling walker with 5" wheels;3 in 1 bedside comode    Recommendations for Other Services    Frequency Min 4X/week   Plan Discharge plan remains appropriate;Frequency remains appropriate    Precautions / Restrictions Precautions Required Braces or Orthoses: Other Brace/Splint Restrictions LLE Weight Bearing: Non weight bearing   Pertinent Vitals/Pain     Mobility  Bed Mobility Bed Mobility: Supine to Sit;Sitting - Scoot to Edge of Bed Supine to Sit: 5: Supervision Sitting - Scoot to Edge of Bed: 5: Supervision Details for Bed Mobility Assistance: [facility] Transfers Sit to Stand: 5: Supervision;From bed;With upper extremity assist Stand to Sit: With upper extremity assist;To chair/3-in-1;5: Supervision Details for Transfer Assistance: Patient to stand fully upright on second attempt at stand with better positioning Ambulation/Gait Ambulation/Gait Assistance: 4: Min guard Ambulation Distance (Feet): 5 Feet Assistive device: Rolling walker Ambulation/Gait Assistance Details: Cues for safety transferring to [facility] Gait Pattern: Step-to pattern Wheelchair Mobility Wheelchair Mobility: Yes Wheelchair Assistance: 5: Supervision Wheelchair Assistance Details (indicate cue type and reason): Cues for parts and managment of RW with turn and propelling. Reviewed and gave handout for  stairs with [facility] Wheelchair Propulsion: Both upper extremities;Right lower extremity Wheelchair Parts Management: Supervision/cueing Distance: 200 Ft    Exercises     PT Diagnosis:    PT Problem List:   PT Treatment Interventions:     PT Goals Acute Rehab PT Goals PT Goal: Supine/Side to Sit - Progress: Progressing toward goal PT Goal: Sit to Supine/Side - Progress: Progressing toward goal PT Goal: Sit to Stand - Progress: Progressing toward goal PT Goal: Stand to Sit - Progress: Progressing toward goal PT Goal: Ambulate - Progress: Progressing toward goal Pt will Propel Wheelchair: > 150 feet;Independently PT Goal: Propel Wheelchair - Progress: Goal set today  Visit Information  Last PT Received On: 04/07/12 Assistance Needed: +1    Subjective Data      Cognition  Overall Cognitive Status: Appears within functional limits for tasks assessed/performed Arousal/Alertness: Awake/alert Orientation Level: Appears intact for tasks assessed Behavior During Session: [facility] for tasks performed    Balance     End of Session PT - End of Session Activity Tolerance: Patient tolerated treatment well Patient left: in chair;with call bell/phone within reach;with family/visitor present   GP     Fredrich Birks 04/07/2012, 11:07 AM 04/07/2012 Fredrich Birks PTA [phone] pager [phone] office

## 2012-04-08 DIAGNOSIS — S20319A Abrasion of unspecified front wall of thorax, initial encounter: Secondary | ICD-10-CM | POA: Diagnosis present

## 2012-04-08 DIAGNOSIS — N390 Urinary tract infection, site not specified: Secondary | ICD-10-CM | POA: Diagnosis not present

## 2012-04-08 DIAGNOSIS — D62 Acute posthemorrhagic anemia: Secondary | ICD-10-CM | POA: Diagnosis not present

## 2012-04-08 LAB — URINE CULTURE
Colony Count: NO GROWTH
Culture: NO GROWTH

## 2012-04-08 MED ORDER — CIPROFLOXACIN HCL 500 MG PO TABS: 500.0000 mg | ORAL_TABLET | Freq: Two times a day (BID) | ORAL | Status: DC

## 2012-04-08 MED ORDER — HYDROCODONE-ACETAMINOPHEN 5-325 MG PO TABS: 1.0000 | ORAL_TABLET | ORAL | Status: DC | PRN

## 2012-04-08 NOTE — Progress Notes (Addendum)
Pt will be living with aunt at discharge.  Her address is:  [address] [place], [place] [address].  The best phone number to reach pt is [phone].  HHPT/OT arranged by pt choice and insurance network with [facility].  DME already ordered and delivered to pt room by [facility] yesterday.

## 2012-04-08 NOTE — Discharge Summary (Signed)
Nichole Ungar, MD, MPH, FACS Pager: [phone]  
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
## 2012-04-08 NOTE — Progress Notes (Signed)
Agree Joanie Duprey, MD, MPH, FACS Pager: [phone]  

## 2012-04-08 NOTE — Progress Notes (Signed)
Patient given discharge instructions and follow up info. Patient in stable condition with no complaints at this time. Pt to be discharged home with mom.
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
## 2012-04-08 NOTE — Progress Notes (Signed)
Subjective: 4 Days Post-Op Procedure(s) (LRB): IRRIGATION AND DEBRIDEMENT EXTREMITY (Left) Patient reports pain as 3 on 0-10 scale.    Objective: Vital signs in last 24 hours: Temp:  [99.2 F (37.3 C)-99.4 F (37.4 C)] 99.2 F (37.3 C) (12/02 2228) Pulse Rate:  [89-91] 89  (12/02 2228) Resp:  [16] 16  (12/02 2228) BP: (108-122)/(60-69) 122/60 mmHg (12/02 2228) SpO2:  [98 %-99 %] 98 % (12/02 2228)  Intake/Output from previous day:   Intake/Output this shift:     Basename 04/07/12 1146  HGB 11.1*    Basename 04/07/12 1146  WBC 8.8  RBC 3.66*  HCT 33.3*  PLT 424*   No results found for this basename: NA:2,K:2,CL:2,CO2:2,BUN:2,CREATININE:2,GLUCOSE:2,CALCIUM:2 in the last 72 hours No results found for this basename: LABPT:2,INR:2 in the last 72 hours  Neurologically intact Dorsiflexion/Plantar flexion intact Good capillary refill. Afebrile. On Cipro for UTI. Reports some dysuria. Assessment/Plan: 4 Days Post-Op Procedure(s) (LRB): IRRIGATION AND DEBRIDEMENT EXTREMITY (Left) Discharge home with home health  lovenox for 10 days then ASA. See Dr. Shelle Iron in one week. NWB. Call if any fevers. Will discuss with family. Again concerns going forward are AVN, infection, nonunion etc. Have discussed with Dr. Victorino Dike for his expertise   Nichole Page [phone].  04/08/2012, 6:18 AM

## 2012-04-10 NOTE — Addendum Note (Signed)
Addendum  created 04/10/12 1056 by Jeani Hawking, CRNA   Modules edited:Anesthesia Medication Administration

## 2012-04-10 NOTE — Addendum Note (Signed)
Addendum  created 04/10/12 1056 by Kema Santaella J Ahleah Simko, CRNA   Modules edited:Anesthesia Medication Administration    

## 2012-08-26 ENCOUNTER — Other Ambulatory Visit: Payer: Self-pay | Admitting: Specialist

## 2012-08-26 DIAGNOSIS — IMO0001 Reserved for inherently not codable concepts without codable children: Secondary | ICD-10-CM

## 2012-08-28 ENCOUNTER — Other Ambulatory Visit: Payer: BC Managed Care – PPO

## 2012-09-01 ENCOUNTER — Other Ambulatory Visit: Payer: BC Managed Care – PPO

## 2012-09-01 ENCOUNTER — Other Ambulatory Visit: Payer: Self-pay | Admitting: Specialist

## 2012-09-01 ENCOUNTER — Ambulatory Visit
Admission: RE | Admit: 2012-09-01 | Discharge: 2012-09-01 | Disposition: A | Discharge status: Home / Self Care | Payer: 59 | Source: Ambulatory Visit | Attending: Specialist | Admitting: Specialist

## 2012-09-01 DIAGNOSIS — IMO0001 Reserved for inherently not codable concepts without codable children: Secondary | ICD-10-CM

## 2013-09-22 LAB — HM PAP SMEAR: HM Pap smear: NORMAL

## 2013-12-01 IMAGING — US US TRANSVAGINAL NON-OB
1 series · 13 of 25 positions shown · non-contrast
Comparison: None

CLINICAL DATA: Left lower quadrant pelvic and back pain.  LMP
02/03/2012



[Series 1: us pelvis complete · 90 acquisitions, 13 frames shown]
[im 1/90]
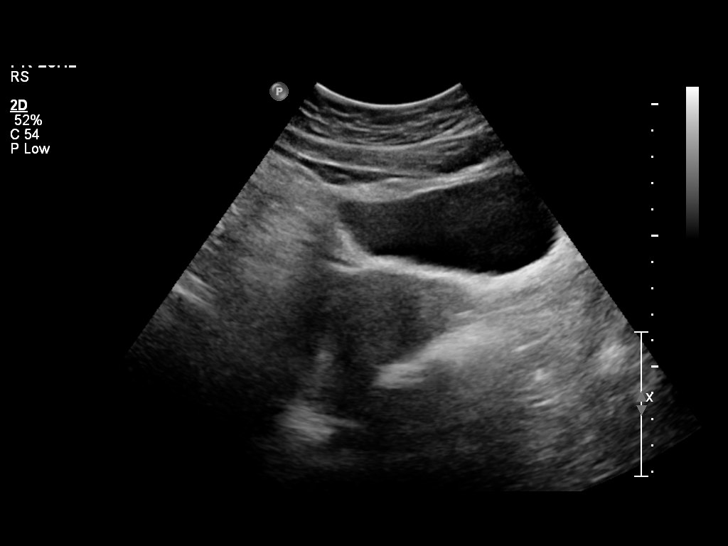
[im 8/90]
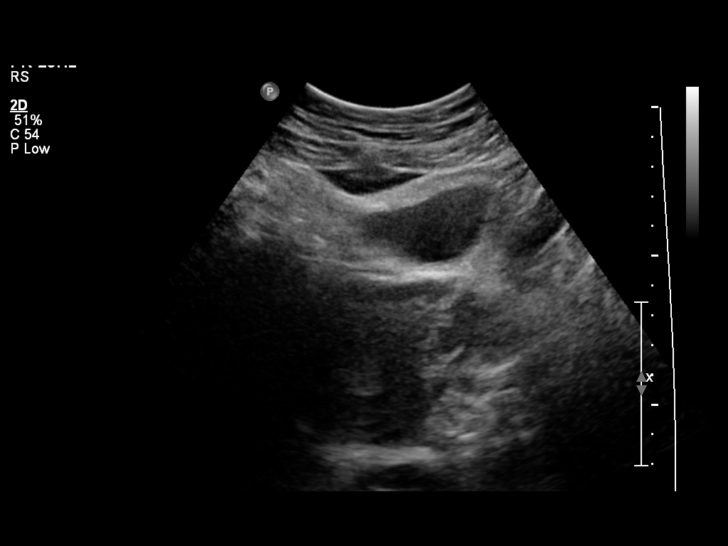
[im 15/90]
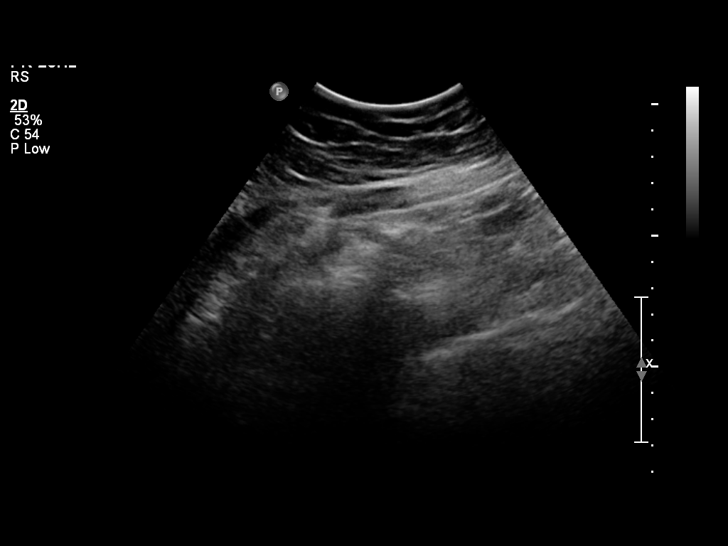
[im 23/90]
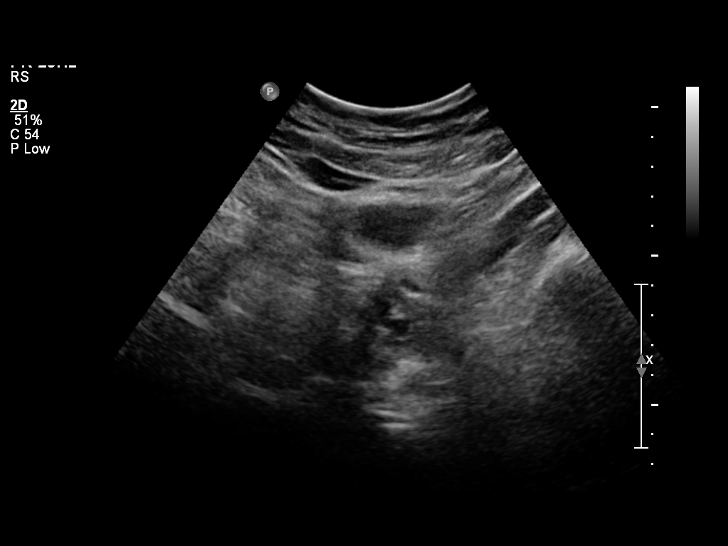
[im 30/90]
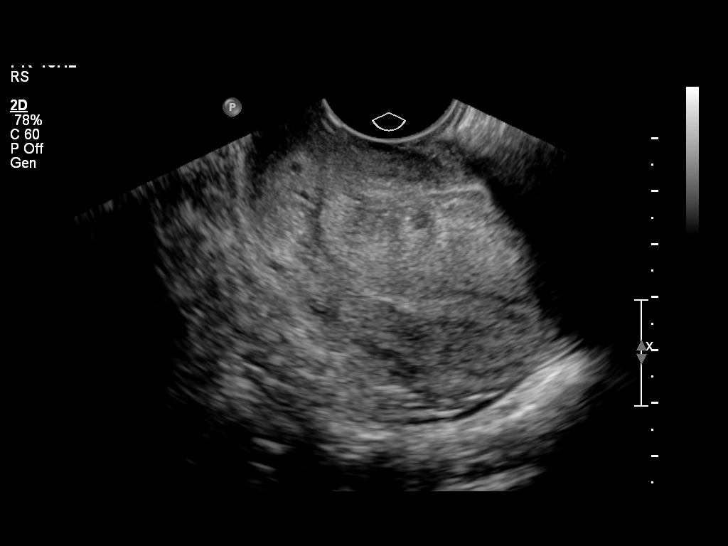
[im 38/90]
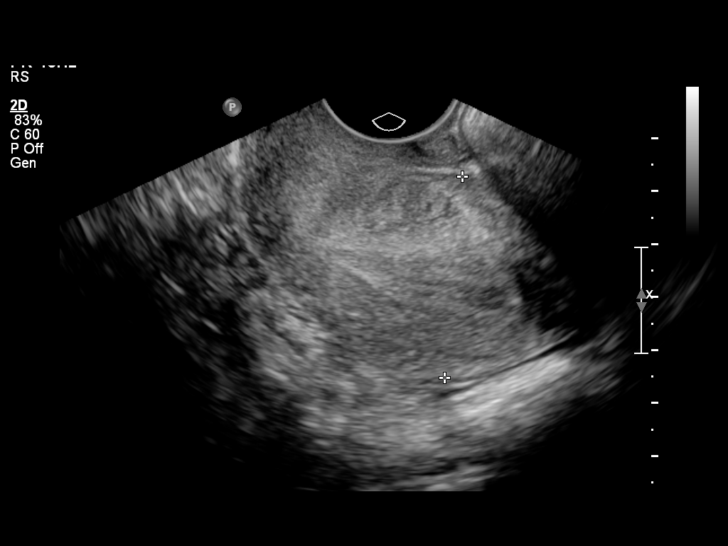
[im 45/90]
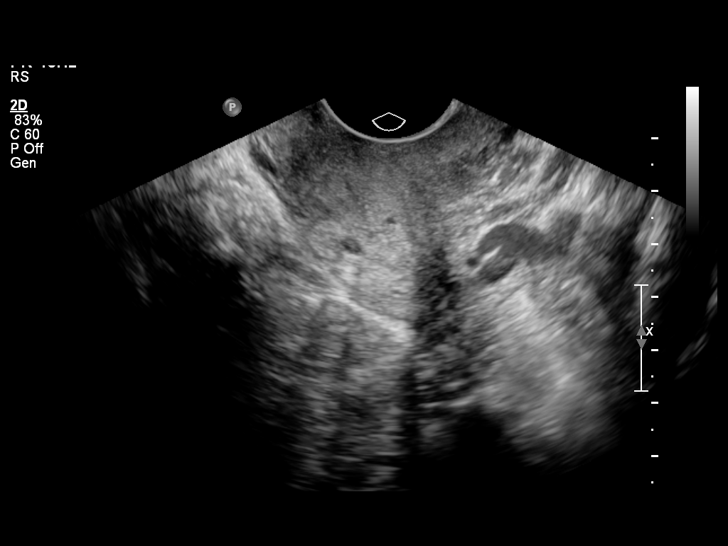
[im 52/90]
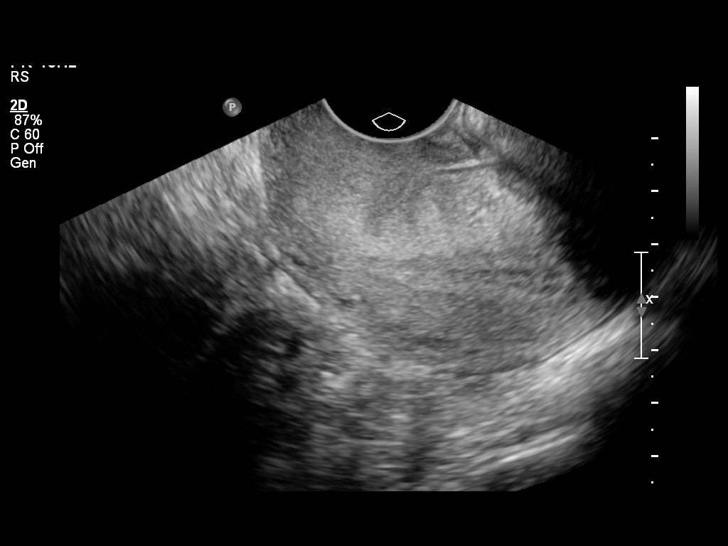
[im 60/90]
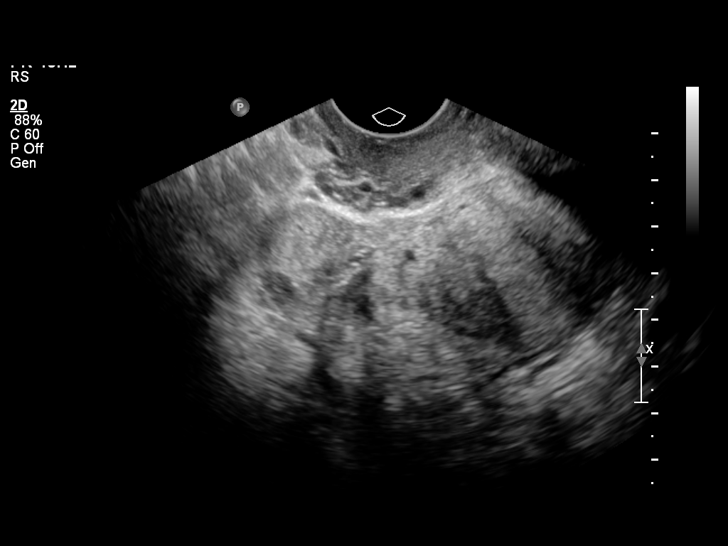
[im 67/90]
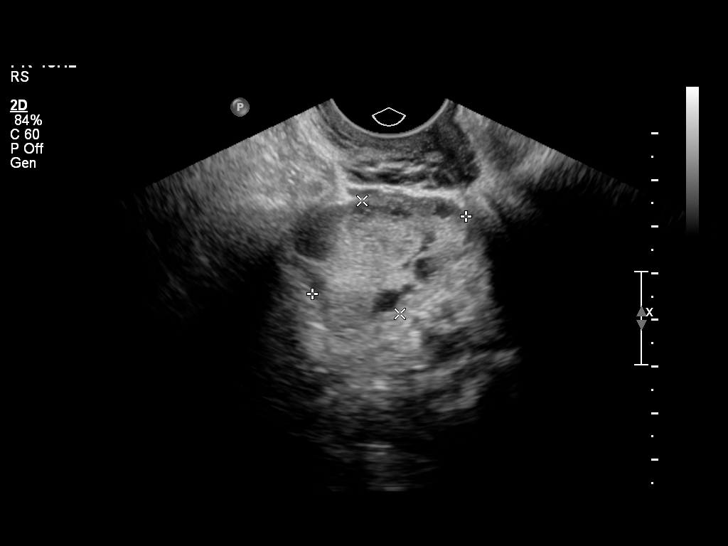
[im 75/90]
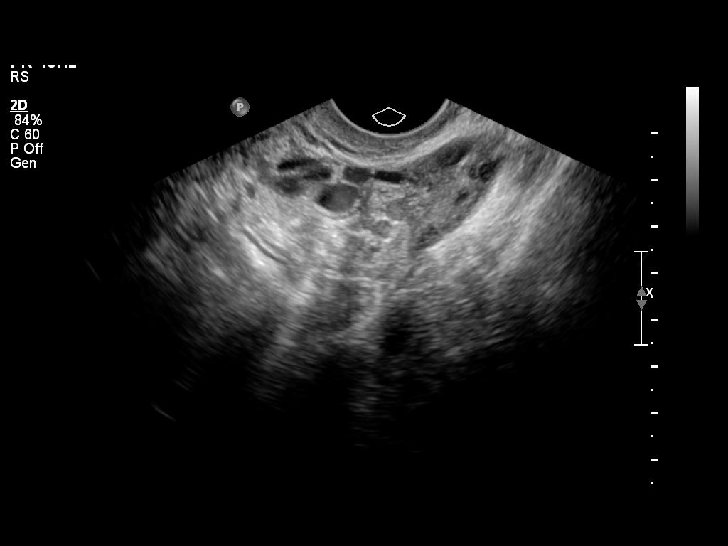
[im 82/90]
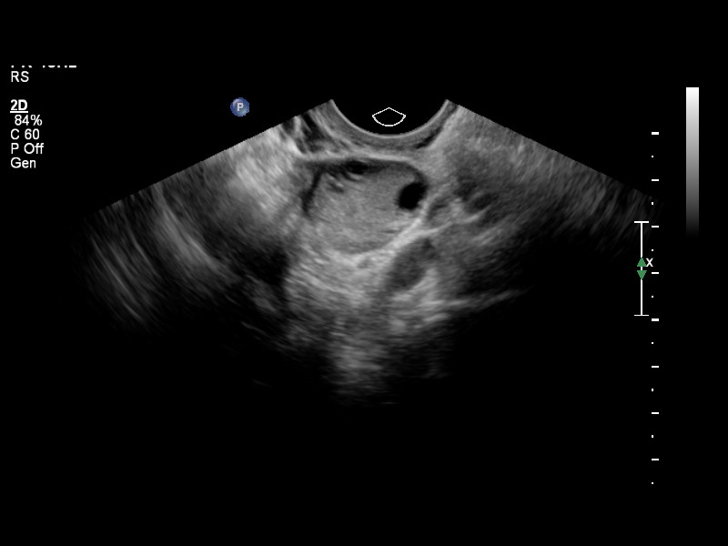
[im 90/90]
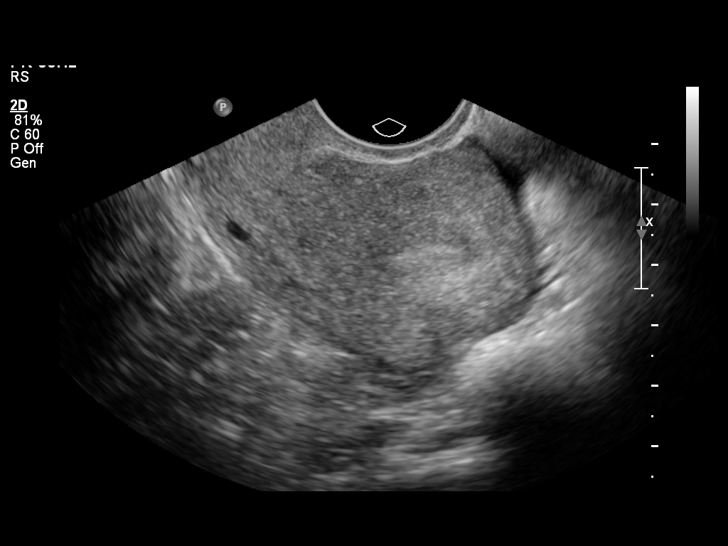

[13 of 25 positions shown; findings below may reference images not displayed]

FINDINGS: Uterus: Is retroverted and retroflexed and demonstrates a sagittal
length of 8.1 cm, depth of 3.8 cm and width of 5.3 cm.  The uterine
myometrium is mildly heterogeneous with no focal abnormality seen

Endometrium: Appears trilayered with a width of 9.4 mm.  No areas
of focal thickening or heterogeneity are seen and this would
correlate with a periovulatory endometrium and correspond with the
provided LMP of 02/03/2012

Right ovary:  Has a normal appearance measuring 2.2 x 3.7 x 2.7 cm

Left ovary: Has a normal appearance measuring 2.0 x 2.6 x 1.8 cm

Other findings: A small amount of fluid is identified in the cul-de-
sac and left adnexa adjacent to the left ovary.
IMPRESSION: Normal periovulatory pelvic ultrasound.

It should be noted that the patient did experience tenderness with
scanning on the left side of the abdomen both transabdominally and
endovaginally.

If further evaluation of the non gynecologic aspects of the pelvis
is desired, abdominal pelvic CT would be recommended..

## 2013-12-30 ENCOUNTER — Emergency Department (HOSPITAL_COMMUNITY): Payer: 59

## 2013-12-30 ENCOUNTER — Emergency Department (HOSPITAL_COMMUNITY)
Admission: EM | Admit: 2013-12-30 | Discharge: 2013-12-30 | Disposition: A | Discharge status: Home / Self Care | Payer: 59 | Attending: Family Medicine | Admitting: Family Medicine

## 2013-12-30 ENCOUNTER — Encounter (HOSPITAL_COMMUNITY): Payer: Self-pay | Admitting: Emergency Medicine

## 2013-12-30 DIAGNOSIS — Z8659 Personal history of other mental and behavioral disorders: Secondary | ICD-10-CM | POA: Insufficient documentation

## 2013-12-30 DIAGNOSIS — S93402A Sprain of unspecified ligament of left ankle, initial encounter: Secondary | ICD-10-CM

## 2013-12-30 DIAGNOSIS — Z8781 Personal history of (healed) traumatic fracture: Secondary | ICD-10-CM | POA: Diagnosis not present

## 2013-12-30 DIAGNOSIS — X500XXA Overexertion from strenuous movement or load, initial encounter: Secondary | ICD-10-CM | POA: Insufficient documentation

## 2013-12-30 DIAGNOSIS — S99919A Unspecified injury of unspecified ankle, initial encounter: Secondary | ICD-10-CM | POA: Diagnosis present

## 2013-12-30 DIAGNOSIS — S93409A Sprain of unspecified ligament of unspecified ankle, initial encounter: Secondary | ICD-10-CM | POA: Diagnosis not present

## 2013-12-30 DIAGNOSIS — Z87891 Personal history of nicotine dependence: Secondary | ICD-10-CM | POA: Insufficient documentation

## 2013-12-30 DIAGNOSIS — S99929A Unspecified injury of unspecified foot, initial encounter: Secondary | ICD-10-CM

## 2013-12-30 DIAGNOSIS — Z791 Long term (current) use of non-steroidal anti-inflammatories (NSAID): Secondary | ICD-10-CM | POA: Diagnosis not present

## 2013-12-30 DIAGNOSIS — Y9389 Activity, other specified: Secondary | ICD-10-CM | POA: Insufficient documentation

## 2013-12-30 DIAGNOSIS — Y9289 Other specified places as the place of occurrence of the external cause: Secondary | ICD-10-CM | POA: Diagnosis not present

## 2013-12-30 DIAGNOSIS — Z8619 Personal history of other infectious and parasitic diseases: Secondary | ICD-10-CM | POA: Insufficient documentation

## 2013-12-30 DIAGNOSIS — S8990XA Unspecified injury of unspecified lower leg, initial encounter: Secondary | ICD-10-CM | POA: Diagnosis present

## 2013-12-30 MED ORDER — KETOROLAC TROMETHAMINE 60 MG/2ML IM SOLN
60.0000 mg | Freq: Once | INTRAMUSCULAR | Status: AC
  Administered 2013-12-30: 60 mg via INTRAMUSCULAR
  Filled 2013-12-30: qty 2

## 2013-12-30 NOTE — ED Provider Notes (Signed)
CSN: [id number]     Arrival date & time 12/30/13  1478 History   First MD Initiated Contact with Patient 12/30/13 1020     Chief Complaint  Patient presents with  . Ankle Pain     (Consider location/radiation/quality/duration/timing/severity/associated sxs/prior Treatment) HPI  L ankle: achy off and on w/ some swelling. This morning when got out of bed heard and felt a snap when gettinng out of bed. Started to swell. Ice w/o benefit. Movement and sensation intact. 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 Bipolar 1 disorder   . STI (sexually transmitted infection)    Past Surgical History  Procedure Laterality Date  . No past surgeries    . Orif ankle fracture  04/02/2012    Procedure: OPEN REDUCTION INTERNAL FIXATION (ORIF) ANKLE FRACTURE;  Surgeon: Javier Docker, MD;  Location: [facility] OR;  Service: Orthopedics;  Laterality: Left;  . I&d extremity  04/02/2012    Procedure: IRRIGATION AND DEBRIDEMENT EXTREMITY;  Surgeon: Javier Docker, MD;  Location: [facility] OR;  Service: Orthopedics;  Laterality: Left;  . I&d extremity  04/04/2012    Procedure: IRRIGATION AND DEBRIDEMENT EXTREMITY;  Surgeon: Javier Docker, MD;  Location: [facility] OR;  Service: Orthopedics;  Laterality: Left;   Family History  Problem Relation Age of Onset  . Diabetes Father   . Stroke Father   . Fibromyalgia Mother    History  Substance Use Topics  . Smoking status: Former Games developer  . Smokeless tobacco: Former Neurosurgeon  . Alcohol Use: Yes     Comment: pt states she drinks a few times a week   OB History   Grav Para Term Preterm Abortions TAB SAB Ect Mult Living   Review of Systems  Musculoskeletal: Positive for arthralgias, gait problem and joint swelling.  All other systems reviewed and are negative.     Allergies  Apple juice  Home Medications   Prior to  Admission medications   Medication Sig Start Date End Date Taking? Authorizing Provider  ibuprofen (ADVIL,MOTRIN) 200 MG tablet Take 400 mg by mouth every 6 (six) hours as needed.   Yes Historical Provider, MD  levonorgestrel (MIRENA) 20 MCG/24HR IUD 1 each by Intrauterine route once.   Yes Historical Provider, MD   BP 136/87  Pulse 68  Temp(Src) 98 F (36.7 C) (Oral)  Resp 18  SpO2 100% Physical Exam  Constitutional: She is oriented to person, place, and time. She appears well-developed and well-nourished. No distress.  HENT:  Head: Normocephalic and atraumatic.  Eyes: EOM are normal. Pupils are equal, round, and reactive to light.  Neck: Normal range of motion.  Cardiovascular: Normal rate, normal heart sounds and intact distal pulses.   Pulmonary/Chest: Effort normal and breath sounds normal.  Abdominal: Soft.  Musculoskeletal:  Lankle w/ nujmerous superficial scars and diffuse swelling. ttp from ankle to mid leg. Minimal ankle laxity. Toe movement intact and sensation intact.   Neurological: She is alert and oriented to person, place, and time. No cranial nerve deficit. She exhibits normal muscle tone. Coordination normal.  Skin: Skin is warm and dry. She is not diaphoretic.  Psychiatric: She has a normal mood and affect. Her behavior is normal. Judgment and thought content normal.    ED Course  Procedures (including  critical care time) Labs Review Labs Reviewed - No data to display  Imaging Review Dg Ankle Complete Left  12/30/2013   CLINICAL DATA:  Fall with popping sensation and swelling.  EXAM: LEFT ANKLE COMPLETE - 3+ VIEW  COMPARISON:  CT 09/01/2012.  FINDINGS: Two screws traverse the medial aspect of the talus. A single pin or rod is seen in the distal fibula. Medial malleolar avulsion fracture is old. Diffuse soft tissue swelling. Talar dome appears grossly intact. No definite acute fracture.  IMPRESSION: 1. Soft tissue swelling without definite acute fracture. 2.  Postoperative changes and remote fractures involving the talus, medial malleolus and distal fibula.   Electronically Signed   By: Leanna Battles M.D.   On: 12/30/2013 10:09     EKG Interpretation None      MDM   Final diagnoses:  Ankle sprain, left, initial encounter   Significant ankle pathology w/ surgical reconstruction in the past.  Xray w/o evidence of fracture. Reimage in 1-2 wks if no improvement Toradol in ED Start NSAIDs in 24 hrs - ice rest elevation - ASO brace and crutches as pt unable to bear weight Discussed early ambulation, ROM exercises , and rehab ankle strengthening when able.   Precautions given and all questions answered  Shelly Flatten, MD Family Medicine 12/30/2013, 10:45 AM     Ozella Rocks, MD 12/30/13 1045

## 2013-12-30 NOTE — Discharge Instructions (Signed)
Your ankle is not broken You likely suffered a minor sprain or ankle strain Pl;ease use the brace adn crutches as needed Remember to do daily ankle strengthening exercises and range of motion exercises Please use an anti inflammatory around the clock for the next 3-5 days Please come back for more xrays if you are no better in 1-2 weeks   Ankle Exercises for Rehabilitation Following ankle injuries, it is as important to follow your caregiver's instructions for regaining full use of your ankle as it was to follow the initial treatment plan following the injury. The following are some suggestions for exercises and treatment, which can be done to help you regain full use of your ankle as soon as possible.  Follow all instructions regarding physical therapy.  Before exercising, it may be helpful to use heat on the muscles or joint being exercised. This loosens up the muscles and tendons (cordlike structure) and decreases chances of injury during your exercises. If this is not possible, just begin your exercises slowly to gradually warm up.  Stand on your toes several times per day to strengthen the calf muscles. These are the muscles in the back of your leg between the knee and the heel. The cord you can feel just above the heel is the Achilles tendon. Rise up on your toes several times repeating this three to four times per day. Do not exercise to the point of pain. If pain starts to develop, decrease the exercise until you are comfortable again.  Do range of motion exercises. This means moving the ankle in all directions. Practice writing the alphabet with your toes in the air. Do not increase beyond a range that is comfortable.  Increase the strength of the muscles in the front of your leg by raising your toes and foot straight up in the air. Repeat this exercise as you did the calf exercise with the same warnings. This also help to stretch your muscles.  Stretch your calf muscles also by leaning  against a wall with your hands in front of you. Put your feet a few feet from the wall and bend your knees until you feel the muscles in your calves become tight.  After exercising it may be helpful to put ice on the ankle to prevent swelling and improve rehabilitation. This may be done for 15 to 20 minutes following your exercises. If exercising is being done in the workplace, this may not always be possible.  Taping an ankle injury may be helpful to give added support following an injury. It also may help prevent reinjury. This may be true if you are in training or in a conditioning program. You and your caregiver can decide on the best course of action to follow. Document Released: 04/20/2000 Document Revised: 09/07/2013 Document Reviewed: 04/17/2008 [facility] Patient Information 2015 [place], [place]. This information is not intended to replace advice given to you by your health care provider. Make sure you discuss any questions you have with your health care provider.

## 2013-12-30 NOTE — ED Notes (Signed)
Per pt sts stood on ankle this am and heard a pop. sts ankle surgery a few years ago. sts sharp stabbing pain .
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
## 2014-03-08 ENCOUNTER — Encounter (HOSPITAL_COMMUNITY): Payer: Self-pay | Admitting: Emergency Medicine

## 2014-03-30 ENCOUNTER — Encounter (HOSPITAL_COMMUNITY): Payer: Self-pay | Admitting: Emergency Medicine

## 2014-03-30 ENCOUNTER — Emergency Department (HOSPITAL_COMMUNITY): Payer: 59

## 2014-03-30 ENCOUNTER — Emergency Department (HOSPITAL_COMMUNITY)
Admission: EM | Admit: 2014-03-30 | Discharge: 2014-03-30 | Disposition: A | Discharge status: Home / Self Care | Payer: 59 | Attending: Emergency Medicine | Admitting: Emergency Medicine

## 2014-03-30 DIAGNOSIS — R05 Cough: Secondary | ICD-10-CM | POA: Insufficient documentation

## 2014-03-30 DIAGNOSIS — N939 Abnormal uterine and vaginal bleeding, unspecified: Secondary | ICD-10-CM | POA: Insufficient documentation

## 2014-03-30 DIAGNOSIS — Z8619 Personal history of other infectious and parasitic diseases: Secondary | ICD-10-CM | POA: Diagnosis not present

## 2014-03-30 DIAGNOSIS — Z8659 Personal history of other mental and behavioral disorders: Secondary | ICD-10-CM | POA: Insufficient documentation

## 2014-03-30 DIAGNOSIS — Z3202 Encounter for pregnancy test, result negative: Secondary | ICD-10-CM | POA: Diagnosis not present

## 2014-03-30 DIAGNOSIS — Z87891 Personal history of nicotine dependence: Secondary | ICD-10-CM | POA: Diagnosis not present

## 2014-03-30 DIAGNOSIS — R509 Fever, unspecified: Secondary | ICD-10-CM | POA: Insufficient documentation

## 2014-03-30 DIAGNOSIS — R059 Cough, unspecified: Secondary | ICD-10-CM

## 2014-03-30 LAB — URINALYSIS, ROUTINE W REFLEX MICROSCOPIC
Bilirubin Urine: NEGATIVE
Glucose, UA: NEGATIVE mg/dL
Ketones, ur: NEGATIVE mg/dL
Leukocytes, UA: NEGATIVE
NITRITE: NEGATIVE
PROTEIN: NEGATIVE mg/dL
SPECIFIC GRAVITY, URINE: 1.021 (ref 1.005–1.030)
UROBILINOGEN UA: 1 mg/dL (ref 0.0–1.0)
pH: 7 (ref 5.0–8.0)

## 2014-03-30 LAB — CBC WITH DIFFERENTIAL/PLATELET
BASOS PCT: 0 % (ref 0–1)
Basophils Absolute: 0 10*3/uL (ref 0.0–0.1)
EOS ABS: 0 10*3/uL (ref 0.0–0.7)
EOS PCT: 0 % (ref 0–5)
HCT: 37.7 % (ref 36.0–46.0)
HEMOGLOBIN: 12.8 g/dL (ref 12.0–15.0)
Lymphocytes Relative: 18 % (ref 12–46)
Lymphs Abs: 2.5 10*3/uL (ref 0.7–4.0)
MCH: 31.4 pg (ref 26.0–34.0)
MCHC: 34 g/dL (ref 30.0–36.0)
MCV: 92.6 fL (ref 78.0–100.0)
MONO ABS: 1.4 10*3/uL — AB (ref 0.1–1.0)
MONOS PCT: 10 % (ref 3–12)
NEUTROS PCT: 72 % (ref 43–77)
Neutro Abs: 9.7 10*3/uL — ABNORMAL HIGH (ref 1.7–7.7)
Platelets: 328 10*3/uL (ref 150–400)
RBC: 4.07 MIL/uL (ref 3.87–5.11)
RDW: 13 % (ref 11.5–15.5)
WBC: 13.6 10*3/uL — ABNORMAL HIGH (ref 4.0–10.5)

## 2014-03-30 LAB — COMPREHENSIVE METABOLIC PANEL
ALBUMIN: 3.7 g/dL (ref 3.5–5.2)
ALT: 11 U/L (ref 0–35)
ANION GAP: 15 (ref 5–15)
AST: 16 U/L (ref 0–37)
Alkaline Phosphatase: 66 U/L (ref 39–117)
BUN: 7 mg/dL (ref 6–23)
CALCIUM: 9.3 mg/dL (ref 8.4–10.5)
CO2: 21 mEq/L (ref 19–32)
CREATININE: 0.68 mg/dL (ref 0.50–1.10)
Chloride: 96 mEq/L (ref 96–112)
GFR calc non Af Amer: >90 mL/min (ref >90)
Glucose, Bld: 103 mg/dL — ABNORMAL HIGH (ref 70–99)
POTASSIUM: 3.5 meq/L — AB (ref 3.7–5.3)
Sodium: 132 mEq/L — ABNORMAL LOW (ref 137–147)
TOTAL PROTEIN: 7.7 g/dL (ref 6.0–8.3)
Total Bilirubin: 0.8 mg/dL (ref 0.3–1.2)

## 2014-03-30 LAB — RAPID STREP SCREEN (MED CTR MEBANE ONLY): Streptococcus, Group A Screen (Direct): NEGATIVE

## 2014-03-30 LAB — URINE MICROSCOPIC-ADD ON

## 2014-03-30 LAB — POC URINE PREG, ED: PREG TEST UR: NEGATIVE

## 2014-03-30 LAB — RPR

## 2014-03-30 LAB — WET PREP, GENITAL
TRICH WET PREP: NONE SEEN
YEAST WET PREP: NONE SEEN

## 2014-03-30 LAB — HIV ANTIBODY (ROUTINE TESTING W REFLEX): HIV: NONREACTIVE

## 2014-03-30 LAB — I-STAT CG4 LACTIC ACID, ED: LACTIC ACID, VENOUS: 0.84 mmol/L (ref 0.5–2.2)

## 2014-03-30 MED ORDER — HYDROMORPHONE HCL 1 MG/ML IJ SOLN
1.0000 mg | Freq: Once | INTRAMUSCULAR | Status: AC
Start: 2014-03-30 — End: 2014-03-30
  Administered 2014-03-30: 1 mg via INTRAVENOUS
  Filled 2014-03-30: qty 1

## 2014-03-30 MED ORDER — HYDROCODONE-ACETAMINOPHEN 5-325 MG PO TABS: 2.0000 | ORAL_TABLET | ORAL | Status: DC | PRN

## 2014-03-30 MED ORDER — ONDANSETRON HCL 4 MG PO TABS
4.0000 mg | ORAL_TABLET | Freq: Four times a day (QID) | ORAL | Status: DC
Start: 2014-03-30 — End: 2018-01-24

## 2014-03-30 MED ORDER — ACETAMINOPHEN 325 MG PO TABS
650.0000 mg | ORAL_TABLET | Freq: Once | ORAL | Status: AC
  Administered 2014-03-30: 650 mg via ORAL
  Filled 2014-03-30: qty 2

## 2014-03-30 MED ORDER — ONDANSETRON HCL 4 MG/2ML IJ SOLN
4.0000 mg | Freq: Once | INTRAMUSCULAR | Status: AC
  Administered 2014-03-30: 4 mg via INTRAVENOUS
  Filled 2014-03-30: qty 2

## 2014-03-30 MED ORDER — SODIUM CHLORIDE 0.9 % IV BOLUS (SEPSIS)
500.0000 mL | Freq: Once | INTRAVENOUS | Status: AC
  Administered 2014-03-30: 500 mL via INTRAVENOUS

## 2014-03-30 MED ORDER — IBUPROFEN 800 MG PO TABS
800.0000 mg | ORAL_TABLET | Freq: Once | ORAL | Status: AC
Start: 2014-03-30 — End: 2014-03-30
  Administered 2014-03-30: 800 mg via ORAL
  Filled 2014-03-30: qty 1

## 2014-03-30 MED ORDER — IBUPROFEN 800 MG PO TABS: 800.0000 mg | ORAL_TABLET | Freq: Three times a day (TID) | ORAL | Status: DC

## 2014-03-30 NOTE — ED Notes (Signed)
Pt reports fever, nausea, sore throat and facial pressure x 2 days. Also c/o abdominal cramping, nausea, vaginal bleeding with increased clots x 5 days. Pt had IUD removed 5 days ago. Pt did not take any OTC medication for fever or pain. [facility] was having increased bleeding and pain. Pt is appropriate, oriented, but very fatigued

## 2014-03-30 NOTE — Discharge Instructions (Signed)
Abnormal Uterine Bleeding Abnormal uterine bleeding can affect women at various stages in life, including teenagers, women in their reproductive years, pregnant women, and women who have reached menopause. Several kinds of uterine bleeding are considered abnormal, including:  Bleeding or spotting between periods.   Bleeding after sexual intercourse.   Bleeding that is heavier or more than normal.   Periods that last longer than usual.  Bleeding after menopause.  Many cases of abnormal uterine bleeding are minor and simple to treat, while others are more serious. Any type of abnormal bleeding should be evaluated by your health care provider. Treatment will depend on the cause of the bleeding. HOME CARE INSTRUCTIONS Monitor your condition for any changes. The following actions may help to alleviate any discomfort you are experiencing:  Avoid the use of tampons and douches as directed by your health care provider.  Change your pads frequently. You should get regular pelvic exams and Pap tests. Keep all follow-up appointments for diagnostic tests as directed by your health care provider.  SEEK MEDICAL CARE IF:   Your bleeding lasts more than 1 week.   You feel dizzy at times.  SEEK IMMEDIATE MEDICAL CARE IF:   You pass out.   You are changing pads every 15 to 30 minutes.   You have abdominal pain.  You have a fever.   You become sweaty or weak.   You are passing large blood clots from the vagina.   You start to feel nauseous and vomit. MAKE SURE YOU:   Understand these instructions.  Will watch your condition.  Will get help right away if you are not doing well or get worse. Document Released: 04/23/2005 Document Revised: 04/28/2013 Document Reviewed: 11/20/2012 [facility] Patient Information 2015 [place], [place]. This information is not intended to replace advice given to you by your health care provider. Make sure you discuss any questions you have with your  health care provider.  Fever, Adult A fever is a higher than normal body temperature. In an adult, an oral temperature around 98.6 F (37 C) is considered normal. A temperature of 100.4 F (38 C) or higher is generally considered a fever. Mild or moderate fevers generally have no long-term effects and often do not require treatment. Extreme fever (greater than or equal to 106 F or 41.1 C) can cause seizures. The sweating that may occur with repeated or prolonged fever may cause dehydration. Elderly people can develop confusion during a fever. A measured temperature can vary with:  Age.  Time of day.  Method of measurement (mouth, underarm, rectal, or ear). The fever is confirmed by taking a temperature with a thermometer. Temperatures can be taken different ways. Some methods are accurate and some are not.  An oral temperature is used most commonly. Electronic thermometers are fast and accurate.  An ear temperature will only be accurate if the thermometer is positioned as recommended by the manufacturer.  A rectal temperature is accurate and done for those adults who have a condition where an oral temperature cannot be taken.  An underarm (axillary) temperature is not accurate and not recommended. Fever is a symptom, not a disease.  CAUSES   Infections commonly cause fever.  Some noninfectious causes for fever include:  Some arthritis conditions.  Some thyroid or adrenal gland conditions.  Some immune system conditions.  Some types of cancer.  A medicine reaction.  High doses of certain street drugs such as methamphetamine.  Dehydration.  Exposure to high outside or room temperatures.  Occasionally, the source of a fever cannot be determined. This is sometimes called a "fever of unknown origin" (FUO).  Some situations may lead to a temporary rise in body temperature that may go away on its own. Examples are:  Childbirth.  Surgery.  Intense exercise. HOME CARE  INSTRUCTIONS   Take appropriate medicines for fever. Follow dosing instructions carefully. If you use acetaminophen to reduce the fever, be careful to avoid taking other medicines that also contain acetaminophen. Do not take aspirin for a fever if you are younger than age 24. There is an association with Reye's syndrome. Reye's syndrome is a rare but potentially deadly disease.  If an infection is present and antibiotics have been prescribed, take them as directed. Finish them even if you start to feel better.  Rest as needed.  Maintain an adequate fluid intake. To prevent dehydration during an illness with prolonged or recurrent fever, you may need to drink extra fluid.Drink enough fluids to keep your urine clear or pale yellow.  Sponging or bathing with room temperature water may help reduce body temperature. Do not use ice water or alcohol sponge baths.  Dress comfortably, but do not over-bundle. SEEK MEDICAL CARE IF:   You are unable to keep fluids down.  You develop vomiting or diarrhea.  You are not feeling at least partly better after 3 days.  You develop new symptoms or problems. SEEK IMMEDIATE MEDICAL CARE IF:   You have shortness of breath or trouble breathing.  You develop excessive weakness.  You are dizzy or you faint.  You are extremely thirsty or you are making little or no urine.  You develop new pain that was not there before (such as in the head, neck, chest, back, or abdomen).  You have persistent vomiting and diarrhea for more than 1 to 2 days.  You develop a stiff neck or your eyes become sensitive to light.  You develop a skin rash.  You have a fever or persistent symptoms for more than 2 to 3 days.  You have a fever and your symptoms suddenly get worse. MAKE SURE YOU:   Understand these instructions.  Will watch your condition.  Will get help right away if you are not doing well or get worse. Document Released: 10/17/2000 Document Revised:  09/07/2013 Document Reviewed: 02/22/2011 [facility] Patient Information 2015 [place], [place]. This information is not intended to replace advice given to you by your health care provider. Make sure you discuss any questions you have with your health care provider. Upper Respiratory Infection, Adult An upper respiratory infection (URI) is also sometimes known as the common cold. The upper respiratory tract includes the nose, sinuses, throat, trachea, and bronchi. Bronchi are the airways leading to the lungs. Most people improve within 1 week, but symptoms can last up to 2 weeks. A residual cough may last even longer.  CAUSES Many different viruses can infect the tissues lining the upper respiratory tract. The tissues become irritated and inflamed and often become very moist. Mucus production is also common. A cold is contagious. You can easily spread the virus to others by oral contact. This includes kissing, sharing a glass, coughing, or sneezing. Touching your mouth or nose and then touching a surface, which is then touched by another person, can also spread the virus. SYMPTOMS  Symptoms typically develop 1 to 3 days after you come in contact with a cold virus. Symptoms vary from person to person. They may include:  Runny nose.  Sneezing.  Nasal  congestion.  Sinus irritation.  Sore throat.  Loss of voice (laryngitis).  Cough.  Fatigue.  Muscle aches.  Loss of appetite.  Headache.  Low-grade fever. DIAGNOSIS  You might diagnose your own cold based on familiar symptoms, since most people get a cold 2 to 3 times a year. Your caregiver can confirm this based on your exam. Most importantly, your caregiver can check that your symptoms are not due to another disease such as strep throat, sinusitis, pneumonia, asthma, or epiglottitis. Blood tests, throat tests, and X-rays are not necessary to diagnose a common cold, but they may sometimes be helpful in excluding other more serious diseases.  Your caregiver will decide if any further tests are required. RISKS AND COMPLICATIONS  You may be at risk for a more severe case of the common cold if you smoke cigarettes, have chronic heart disease (such as heart failure) or lung disease (such as asthma), or if you have a weakened immune system. The very young and very old are also at risk for more serious infections. Bacterial sinusitis, middle ear infections, and bacterial pneumonia can complicate the common cold. The common cold can worsen asthma and chronic obstructive pulmonary disease (COPD). Sometimes, these complications can require emergency medical care and may be life-threatening. PREVENTION  The best way to protect against getting a cold is to practice good hygiene. Avoid oral or hand contact with people with cold symptoms. Wash your hands often if contact occurs. There is no clear evidence that vitamin C, vitamin E, echinacea, or exercise reduces the chance of developing a cold. However, it is always recommended to get plenty of rest and practice good nutrition. TREATMENT  Treatment is directed at relieving symptoms. There is no cure. Antibiotics are not effective, because the infection is caused by a virus, not by bacteria. Treatment may include:  Increased fluid intake. Sports drinks offer valuable electrolytes, sugars, and fluids.  Breathing heated mist or steam (vaporizer or shower).  Eating chicken soup or other clear broths, and maintaining good nutrition.  Getting plenty of rest.  Using gargles or lozenges for comfort.  Controlling fevers with ibuprofen or acetaminophen as directed by your caregiver.  Increasing usage of your inhaler if you have asthma. Zinc gel and zinc lozenges, taken in the first 24 hours of the common cold, can shorten the duration and lessen the severity of symptoms. Pain medicines may help with fever, muscle aches, and throat pain. A variety of non-prescription medicines are available to treat  congestion and runny nose. Your caregiver can make recommendations and may suggest nasal or lung inhalers for other symptoms.  HOME CARE INSTRUCTIONS   Only take over-the-counter or prescription medicines for pain, discomfort, or fever as directed by your caregiver.  Use a warm mist humidifier or inhale steam from a shower to increase air moisture. This may keep secretions moist and make it easier to breathe.  Drink enough water and fluids to keep your urine clear or pale yellow.  Rest as needed.  Return to work when your temperature has returned to normal or as your caregiver advises. You may need to stay home longer to avoid infecting others. You can also use a face mask and careful hand washing to prevent spread of the virus. SEEK MEDICAL CARE IF:   After the first few days, you feel you are getting worse rather than better.  You need your caregiver's advice about medicines to control symptoms.  You develop chills, worsening shortness of breath, or brown or red  sputum. These may be signs of pneumonia.  You develop yellow or brown nasal discharge or pain in the face, especially when you bend forward. These may be signs of sinusitis.  You develop a fever, swollen neck glands, pain with swallowing, or white areas in the back of your throat. These may be signs of strep throat. SEEK IMMEDIATE MEDICAL CARE IF:   You have a fever.  You develop severe or persistent headache, ear pain, sinus pain, or chest pain.  You develop wheezing, a prolonged cough, cough up blood, or have a change in your usual mucus (if you have chronic lung disease).  You develop sore muscles or a stiff neck. Document Released: 10/17/2000 Document Revised: 07/16/2011 Document Reviewed: 07/29/2013 [facility] Patient Information 2015 [place], [place]. This information is not intended to replace advice given to you by your health care provider. Make sure you discuss any questions you have with your health care  provider.

## 2014-03-30 NOTE — ED Provider Notes (Signed)
CSN: [id number]     Arrival date & time 03/30/14  1115 History   First MD Initiated Contact with Patient 03/30/14 1156     Chief Complaint  Patient presents with  . Vaginal Bleeding    5 days post removal of IUD  . Sore Throat    2 day hx of sore throat  . Facial Pain    sinus pressure and headache x 2 days  . Back Pain    pain in upper, lower and midback  . Nausea    x 1 day  . Abdominal Cramping  . Fever    did not attempt to treat     (Consider location/radiation/quality/duration/timing/severity/associated sxs/prior Treatment) HPI Comments: Patient presents to the ER for evaluation of fever, nausea, sore throat, sinus and facial pressure. Symptoms began 2 days ago. Patient not experiencing shortness of breath. She has not had any diarrhea or vomiting.  Patient also complaining vaginal bleeding. Patient reports that she had an IUD removed by her OB/GYN doctor 5 days ago. She started bleeding the day after the IUD was removed and she is passing clots. She is complaining of pelvic pain and cramping.  Patient is a 23 y.o. female presenting with vaginal bleeding, pharyngitis, back pain, cramps, and fever.  Vaginal Bleeding Associated symptoms: back pain and fever   Sore Throat  Back Pain Associated symptoms: fever and pelvic pain   Abdominal Cramping  Fever   Past Medical History  Diagnosis Date  . Bipolar 1 disorder   . STI (sexually transmitted infection)    Past Surgical History  Procedure Laterality Date  . No past surgeries    . Orif ankle fracture  04/02/2012    Procedure: OPEN REDUCTION INTERNAL FIXATION (ORIF) ANKLE FRACTURE;  Surgeon: Javier DockerJeffrey C Beane, MD;  Location: [facility] OR;  Service: Orthopedics;  Laterality: Left;  . I&d extremity  04/02/2012    Procedure: IRRIGATION AND DEBRIDEMENT EXTREMITY;  Surgeon: Javier DockerJeffrey C Beane, MD;  Location: [facility] OR;  Service: Orthopedics;  Laterality: Left;  . I&d extremity  04/04/2012    Procedure: IRRIGATION AND DEBRIDEMENT  EXTREMITY;  Surgeon: Javier DockerJeffrey C Beane, MD;  Location: [facility] OR;  Service: Orthopedics;  Laterality: Left;   Family History  Problem Relation Age of Onset  . Diabetes Father   . Stroke Father   . Fibromyalgia Mother    History  Substance Use Topics  . Smoking status: Former Games developermoker  . Smokeless tobacco: Former NeurosurgeonUser  . Alcohol Use: Yes     Comment: pt states she drinks a few times a week   OB History    Gravida Para Term Preterm AB TAB SAB Ectopic Multiple Living   1 1 1       1      Review of Systems  Constitutional: Positive for fever.  Genitourinary: Positive for vaginal bleeding and pelvic pain.  Musculoskeletal: Positive for back pain.  All other systems reviewed and are negative.     Allergies  Apple juice  Home Medications   Prior to Admission medications   Medication Sig Start Date End Date Taking? Authorizing Provider  etonogestrel-ethinyl estradiol (NUVARING) 0.12-0.015 MG/24HR vaginal ring Place 1 each vaginally every 28 (twenty-eight) days. Insert vaginally and leave in place for 3 consecutive weeks, then remove for 1 week.    Historical Provider, MD   BP 125/43 mmHg  Pulse 98  Temp(Src) 98.5 F (36.9 C) (Oral)  Resp 16  Wt 224 lb (101.606 kg)  SpO2 97%  LMP 03/26/2014 Physical  Exam  Constitutional: She is oriented to person, place, and time. She appears well-developed and well-nourished. No distress.  HENT:  Head: Normocephalic and atraumatic.  Right Ear: Hearing normal.  Left Ear: Hearing normal.  Nose: Nose normal.  Mouth/Throat: Oropharynx is clear and moist and mucous membranes are normal.  Eyes: Conjunctivae and EOM are normal. Pupils are equal, round, and reactive to light.  Neck: Normal range of motion. Neck supple. No muscular tenderness present. No Brudzinski's sign and no Kernig's sign noted.  Cardiovascular: Regular rhythm, S1 normal and S2 normal.  Exam reveals no gallop and no friction rub.   No murmur heard. Pulmonary/Chest: Effort normal  and breath sounds normal. No respiratory distress. She exhibits no tenderness.  Abdominal: Soft. Normal appearance and bowel sounds are normal. There is no hepatosplenomegaly. There is no tenderness. There is no rebound, no guarding, no tenderness at McBurney's point and negative Murphy's sign. No hernia. Hernia confirmed negative in the right inguinal area and confirmed negative in the left inguinal area.  Genitourinary: There is no rash or tenderness on the right labia. There is no rash or tenderness on the left labia. Uterus is tender. Uterus is not enlarged. Cervix exhibits no motion tenderness and no discharge. Right adnexum displays no mass and no tenderness. Left adnexum displays no mass and no tenderness. There is bleeding in the vagina.  Musculoskeletal: Normal range of motion.  Neurological: She is alert and oriented to person, place, and time. She has normal strength. No cranial nerve deficit or sensory deficit. Coordination normal. GCS eye subscore is 4. GCS verbal subscore is 5. GCS motor subscore is 6.  Skin: Skin is warm, dry and intact. No rash noted. No cyanosis.  Psychiatric: She has a normal mood and affect. Her speech is normal and behavior is normal. Thought content normal.  Nursing note and vitals reviewed.   ED Course  Procedures (including critical care time) Labs Review Labs Reviewed  WET PREP, GENITAL - Abnormal; Notable for the following:    Clue Cells Wet Prep HPF POC FEW (*)    WBC, Wet Prep HPF POC FEW (*)    All other components within normal limits  CBC WITH DIFFERENTIAL - Abnormal; Notable for the following:    WBC 13.6 (*)    Neutro Abs 9.7 (*)    Monocytes Absolute 1.4 (*)    All other components within normal limits  COMPREHENSIVE METABOLIC PANEL - Abnormal; Notable for the following:    Sodium 132 (*)    Potassium 3.5 (*)    Glucose, Bld 103 (*)    All other components within normal limits  RAPID STREP SCREEN  GC/CHLAMYDIA PROBE AMP  CULTURE, GROUP A  STREP  URINALYSIS, ROUTINE W REFLEX MICROSCOPIC  RPR  HIV ANTIBODY (ROUTINE TESTING)  I-STAT CG4 LACTIC ACID, ED  POC URINE PREG, ED    Imaging Review Dg Chest 2 View  03/30/2014   CLINICAL DATA:  Cough and difficulty breathing ; chest pain  EXAM: CHEST  2 VIEW  COMPARISON:  April 03, 2012  FINDINGS: The lungs are clear. The heart size and pulmonary vascularity are normal. No pneumothorax. No adenopathy. There is slight upper thoracic levoscoliosis.  IMPRESSION: No edema or consolidation.   Electronically Signed   By: Bretta BangWilliam  Woodruff M.D.   On: 03/30/2014 13:22   [place] Transvaginal Non-ob  03/30/2014   CLINICAL DATA:  Menorrhagia  EXAM: TRANSABDOMINAL AND TRANSVAGINAL ULTRASOUND OF PELVIS  TECHNIQUE: Study was performed transabdominally to optimize pelvic field  of view evaluation and transvaginally to optimize internal visceral architecture evaluation.  COMPARISON:  February 14, 2012  FINDINGS: Uterus  Measurements: 7.1 x 4.5 x 5.9 cm. No fibroids or other mass visualized.  Endometrium  Thickness: 7 mm. A small amount of hemorrhage is noted within the endometrium.  Right ovary  Measurements: 2.7 x 2.2 x 2.3 cm. Normal appearance/no adnexal mass.  Left ovary  Measurements: 2.6 x 2.4 x 2.8 cm. Normal appearance/no adnexal mass.  Other findings  No free fluid.  IMPRESSION: Small amount of hemorrhage within the endometrium. The endometrium overall is not thickened. Study otherwise unremarkable.   Electronically Signed   By: Bretta Bang M.D.   On: 03/30/2014 14:30   US Pelvis Complete  03/30/2014   CLINICAL DATA:  Menorrhagia  EXAM: TRANSABDOMINAL AND TRANSVAGINAL ULTRASOUND OF PELVIS  TECHNIQUE: Study was performed transabdominally to optimize pelvic field of view evaluation and transvaginally to optimize internal visceral architecture evaluation.  COMPARISON:  February 14, 2012  FINDINGS: Uterus  Measurements: 7.1 x 4.5 x 5.9 cm. No fibroids or other mass visualized.  Endometrium  Thickness: 7  mm. A small amount of hemorrhage is noted within the endometrium.  Right ovary  Measurements: 2.7 x 2.2 x 2.3 cm. Normal appearance/no adnexal mass.  Left ovary  Measurements: 2.6 x 2.4 x 2.8 cm. Normal appearance/no adnexal mass.  Other findings  No free fluid.  IMPRESSION: Small amount of hemorrhage within the endometrium. The endometrium overall is not thickened. Study otherwise unremarkable.   Electronically Signed   By: Bretta Bang M.D.   On: 03/30/2014 14:30     EKG Interpretation None      MDM   Final diagnoses:  Cough  Abnormal uterine bleeding   Presents to the ER for evaluation of multiple problems. Patient had a NuvaRing IUD removed 5 days ago. She started having vaginal bleeding, passing clots, pelvic cramping the day after removal of the IUD. Patient's abdominal exam was benign. Pelvic exam revealed small amount of blood in the vaginal fornix, no other acute abnormality. No significant uterine enlargement. Ultrasound was performed, patient has a small amount of blood in the endometrium, no significant endometrial thickening. No other abnormalities. Bleeding likely secondary to removal of the IUD removal of exogenous estrogen. No intervention necessary.  Patient complaining of fever, sore throat, cough. Symptoms appear to be viral in nature. Workup was unremarkable. Strep test negative. Chest x-ray negative for pneumonia or other acute process. Patient's fever treated with Motrin and Tylenol and she has significantly improved. Patient given IV fluids. Remainder of vital signs are unremarkable. Tachycardia that was present with fever has resolved. Patient will be discharged, continue to treat fever, symptomatic care.  Patient is to follow-up with her OB/GYN in the office, return if her bleeding worsens.    Gilda Crease, MD 03/30/14 1600

## 2014-03-30 NOTE — ED Notes (Signed)
Pt states she is unable to give urine sample at this time. 

## 2014-03-31 LAB — GC/CHLAMYDIA PROBE AMP
CT Probe RNA: POSITIVE — AB
GC Probe RNA: NEGATIVE

## 2014-04-01 LAB — CULTURE, GROUP A STREP

## 2014-04-01 MED ORDER — AZITHROMYCIN 250 MG PO TABS: 1000.0000 mg | ORAL_TABLET | Freq: Once | ORAL | Status: DC

## 2014-04-03 ENCOUNTER — Telehealth: Payer: Self-pay | Admitting: Emergency Medicine

## 2014-04-06 ENCOUNTER — Telehealth (HOSPITAL_BASED_OUTPATIENT_CLINIC_OR_DEPARTMENT_OTHER): Payer: Self-pay | Admitting: Emergency Medicine

## 2014-04-06 NOTE — Telephone Encounter (Signed)
+   chlamydia Treatment of Azithromycin 1000mg  [facility] @ [phone] faxed to [phone] Educated re abstinence and notification of sexual partners

## 2014-06-21 ENCOUNTER — Emergency Department (HOSPITAL_COMMUNITY): Payer: Self-pay

## 2014-06-21 ENCOUNTER — Encounter (HOSPITAL_COMMUNITY): Payer: Self-pay | Admitting: Emergency Medicine

## 2014-06-21 ENCOUNTER — Emergency Department (HOSPITAL_COMMUNITY)
Admission: EM | Admit: 2014-06-21 | Discharge: 2014-06-21 | Disposition: A | Discharge status: Home / Self Care | Payer: Self-pay | Attending: Emergency Medicine | Admitting: Emergency Medicine

## 2014-06-21 ENCOUNTER — Emergency Department (HOSPITAL_COMMUNITY): Payer: No Typology Code available for payment source

## 2014-06-21 DIAGNOSIS — S238XXA Sprain of other specified parts of thorax, initial encounter: Secondary | ICD-10-CM | POA: Insufficient documentation

## 2014-06-21 DIAGNOSIS — S134XXA Sprain of ligaments of cervical spine, initial encounter: Secondary | ICD-10-CM | POA: Insufficient documentation

## 2014-06-21 DIAGNOSIS — Z8659 Personal history of other mental and behavioral disorders: Secondary | ICD-10-CM | POA: Insufficient documentation

## 2014-06-21 DIAGNOSIS — Z3202 Encounter for pregnancy test, result negative: Secondary | ICD-10-CM | POA: Insufficient documentation

## 2014-06-21 DIAGNOSIS — M25561 Pain in right knee: Secondary | ICD-10-CM

## 2014-06-21 DIAGNOSIS — R51 Headache: Secondary | ICD-10-CM

## 2014-06-21 DIAGNOSIS — Y92410 Unspecified street and highway as the place of occurrence of the external cause: Secondary | ICD-10-CM | POA: Insufficient documentation

## 2014-06-21 DIAGNOSIS — S8991XA Unspecified injury of right lower leg, initial encounter: Secondary | ICD-10-CM | POA: Insufficient documentation

## 2014-06-21 DIAGNOSIS — Z8619 Personal history of other infectious and parasitic diseases: Secondary | ICD-10-CM | POA: Insufficient documentation

## 2014-06-21 DIAGNOSIS — S239XXA Sprain of unspecified parts of thorax, initial encounter: Secondary | ICD-10-CM

## 2014-06-21 DIAGNOSIS — S0990XA Unspecified injury of head, initial encounter: Secondary | ICD-10-CM | POA: Insufficient documentation

## 2014-06-21 DIAGNOSIS — S3991XA Unspecified injury of abdomen, initial encounter: Secondary | ICD-10-CM | POA: Insufficient documentation

## 2014-06-21 DIAGNOSIS — Y9389 Activity, other specified: Secondary | ICD-10-CM | POA: Insufficient documentation

## 2014-06-21 DIAGNOSIS — S299XXA Unspecified injury of thorax, initial encounter: Secondary | ICD-10-CM | POA: Insufficient documentation

## 2014-06-21 DIAGNOSIS — S335XXA Sprain of ligaments of lumbar spine, initial encounter: Secondary | ICD-10-CM

## 2014-06-21 DIAGNOSIS — Y998 Other external cause status: Secondary | ICD-10-CM | POA: Insufficient documentation

## 2014-06-21 DIAGNOSIS — S339XXA Sprain of unspecified parts of lumbar spine and pelvis, initial encounter: Secondary | ICD-10-CM | POA: Insufficient documentation

## 2014-06-21 DIAGNOSIS — R519 Headache, unspecified: Secondary | ICD-10-CM

## 2014-06-21 DIAGNOSIS — S139XXA Sprain of joints and ligaments of unspecified parts of neck, initial encounter: Secondary | ICD-10-CM

## 2014-06-21 DIAGNOSIS — Z87891 Personal history of nicotine dependence: Secondary | ICD-10-CM | POA: Insufficient documentation

## 2014-06-21 DIAGNOSIS — Z791 Long term (current) use of non-steroidal anti-inflammatories (NSAID): Secondary | ICD-10-CM | POA: Insufficient documentation

## 2014-06-21 LAB — I-STAT CHEM 8, ED
BUN: 5 mg/dL — ABNORMAL LOW (ref 6–23)
CREATININE: 0.6 mg/dL (ref 0.50–1.10)
Calcium, Ion: 1.18 mmol/L (ref 1.12–1.23)
Chloride: 105 mmol/L (ref 96–112)
GLUCOSE: 84 mg/dL (ref 70–99)
HCT: 44 % (ref 36.0–46.0)
Hemoglobin: 15 g/dL (ref 12.0–15.0)
POTASSIUM: 4.4 mmol/L (ref 3.5–5.1)
Sodium: 142 mmol/L (ref 135–145)
TCO2: 23 mmol/L (ref 0–100)

## 2014-06-21 LAB — URINALYSIS, ROUTINE W REFLEX MICROSCOPIC
Bilirubin Urine: NEGATIVE
GLUCOSE, UA: NEGATIVE mg/dL
KETONES UR: NEGATIVE mg/dL
LEUKOCYTES UA: NEGATIVE
Nitrite: NEGATIVE
Protein, ur: NEGATIVE mg/dL
Specific Gravity, Urine: 1.016 (ref 1.005–1.030)
Urobilinogen, UA: 0.2 mg/dL (ref 0.0–1.0)
pH: 5 (ref 5.0–8.0)

## 2014-06-21 LAB — URINE MICROSCOPIC-ADD ON

## 2014-06-21 LAB — PREGNANCY, URINE: Preg Test, Ur: NEGATIVE

## 2014-06-21 MED ORDER — KETOROLAC TROMETHAMINE 30 MG/ML IJ SOLN
30.0000 mg | Freq: Once | INTRAMUSCULAR | Status: AC
  Administered 2014-06-21: 30 mg via INTRAVENOUS
  Filled 2014-06-21: qty 1

## 2014-06-21 MED ORDER — CYCLOBENZAPRINE HCL 10 MG PO TABS: 10.0000 mg | ORAL_TABLET | Freq: Two times a day (BID) | ORAL | Status: DC | PRN

## 2014-06-21 MED ORDER — FENTANYL CITRATE 0.05 MG/ML IJ SOLN
50.0000 ug | Freq: Once | INTRAMUSCULAR | Status: DC
  Filled 2014-06-21: qty 2

## 2014-06-21 MED ORDER — METOCLOPRAMIDE HCL 5 MG/ML IJ SOLN
10.0000 mg | Freq: Once | INTRAMUSCULAR | Status: AC
  Administered 2014-06-21: 10 mg via INTRAVENOUS
  Filled 2014-06-21: qty 2

## 2014-06-21 MED ORDER — OXYCODONE-ACETAMINOPHEN 5-325 MG PO TABS
2.0000 | ORAL_TABLET | Freq: Once | ORAL | Status: AC
  Administered 2014-06-21: 2 via ORAL
  Filled 2014-06-21: qty 2

## 2014-06-21 MED ORDER — FENTANYL CITRATE 0.05 MG/ML IJ SOLN
50.0000 ug | Freq: Once | INTRAMUSCULAR | Status: AC
  Administered 2014-06-21: 50 ug via INTRAMUSCULAR

## 2014-06-21 MED ORDER — IBUPROFEN 800 MG PO TABS: 800.0000 mg | ORAL_TABLET | Freq: Three times a day (TID) | ORAL | Status: DC
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
## 2014-06-21 NOTE — ED Notes (Signed)
Bed: WA01 Expected date:  Expected time:  Means of arrival:  Comments: EMS 

## 2014-06-21 NOTE — ED Notes (Signed)
Nurse starting IV 
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
## 2014-06-21 NOTE — Discharge Instructions (Signed)
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 basis.  Medicaid-accepting [facility] Providers:  Organization         Address  Phone   Notes  [facility] [address], [place] [phone] Also accepts self-pay patients.  [facility] [address], [place]  [phone]   [facility] [address], [place] [phone]   [facility] [address] [place] [address], [place] [phone]   Renaye RakersVeita Bland [address], [place]   [phone] Only accepts [place] Access [place] patients after they have their name applied to their card.   Self-Pay (no insurance) in [facility]:  Organization         Address  Phone   Notes  Sickle Cell Patients, [facility] [address], [place] [phone]   [facility] [address], [place] [phone]   [facility]  [place]  [address], [place] [phone]) [phone]   [facility]/Dr. Osei-Bonsu  [address] [place] [address], [place] or [phone], [place] [phone] Phone number for both [place] and [place] locations is the same.  [facility] [address], [place] [phone]   [facility] [place] [address] [place] [address], [place] or [address] [phone]   [facility] [address], [place] [phone], phone; [phone](336) [phone], fax Sees patients 1st and 3rd Saturday of every month.  Must not qualify for public or private insurance (i.e. Medicaid, Medicare, [place] Health Choice, Veterans' Benefits)  Household income should be no more than 200% of the poverty level The clinic cannot treat you if you are pregnant or think you are pregnant  Sexually transmitted diseases are not treated at the clinic.    Dental Care: Organization         Address  Phone  Notes  [facility] [address], [place] [phone] Accepts children up to age 25 who are enrolled in [place]icaid or [place] Health Choice; pregnant women with a Medicaid card; and children who have applied for Medicaid or [place] Health Choice, but were declined, whose parents can pay a reduced fee at time of service.  [facility] [place]  [address], [place] [phone] Accepts children up to age 25 who are enrolled in [place] or [place] Health Choice; pregnant women with a Medicaid card; and children who have applied for Medicaid or [place] Health Choice, but were declined, whose parents can pay a reduced fee at time of service.  [facility]  [address], [place] [phone] Patients are seen by appointment only. Walk-ins are not accepted. [facility] will see patients 25 years of age and  older. Monday - Tuesday (8am-5pm) Most Wednesdays (8:30-5pm) $30 per visit, cash only  [facility]  [address], [facility] [phone] Patients are seen by appointment only. Walk-ins are not accepted. [facility] will see patients 25 years of age and older. One Wednesday Evening (Monthly: Volunteer Based).  $30 per visit, cash only  [facility]  [phone] for adults; Children under age 704, call Graduate Pediatric Dentistry at [phone]. Children aged 734-14, please call [phone] to request a  pediatric application.  Dental services are provided in all areas of dental care including fillings, crowns and bridges, complete and partial dentures, implants, gum treatment, root canals, and extractions. Preventive care is also provided. Treatment is provided to both adults and children. Patients are selected via a lottery and there is often a waiting list.   [facility] [address], [place]  [phone] [email or web address]   [facility] [address], [place], [place] [phone], Ext. [phone] Second and Fourth Thursday of each month, opens at 6:30 AM; Clinic ends at 9 AM.  Patients are seen on a first-come first-served basis, and a limited number are seen during each clinic.   [facility]  [address], [place], [place] [phone]   Eligibility Requirements You must have lived in [place], [place], or [place] counties for at least the last three months.   You cannot be eligible for state or federal sponsored National Cityhealthcare insurance, including CIGNAVeterans Administration, [place]icaid, or Harrah[employer] EntertainmentMedicare.   You generally cannot be eligible for healthcare insurance through your employer.    How to apply: Eligibility screenings are held every Tuesday and Wednesday afternoon from 1:00 pm until 4:00 pm. You do not need an appointment for the interview!  [facility] [address],  [place], [place] [phone]   [facility]  [phone]   [facility]  [phone]   [facility]  [phone]    Behavioral Health Resources in the Community: Intensive Outpatient Programs Organization         Address  Phone  Notes  [facility] [address], [place], [place] [phone]   [facility] [address], [place], [place] [phone]   [facility] [address], [place], [place]  [phone]   [facility] [address],  [place], [place] [phone] or [phone]   Substance Abuse Resources Organization         Address  Phone  Notes  [facility]  [phone]   [facility]  [phone]   [facility]  [phone]   [facility]  [phone]   Residential & Outpatient Substance Abuse Program  [phone]   Psychological Services Organization         Address  Phone  Notes  [facility] [place] [facility]  [phone]- [phone]   [facility]  [phone]   [facility] [address], [place] [phone] or [phone]    Mobile Crisis Teams Organization         Address  Phone  Notes  [facility], Mobile Crisis Care Unit  [phone]   [facility]  [address] [place], [place] [phone]   Doristine LocksSharon DeEsch [address] [place] [phone]    Self-Help/Support Groups Organization         Address  Phone             Notes  [facility] [place] - variety of support groups  336- [phone] Call for more information  Narcotics Anonymous (NA), [facility] [address], [place]  2 meetings at this location   Chief Executive Officeresidential Treatment Programs Organization         Address  Phone  Notes  [facility] [address],    [place]  [phone]   [facility]  [address], [place]  [address], [place], [place] [phone]   °[facility] [address], [place] [phone] Admissions: 8am-3pm M-F  °[facility] [address]    °[place], [place] [phone]   °[facility] [address], [place], [place] [phone]   °[facility] [address]  °[place], [place] [phone]   °[facility] - Intensive Outpatient [address], [place], [place] [phone]   °[facility] ([facility] [address]  °[place], [place] [phone] or [phone]   °[facility] ([facility]) [address] [place], [place] [phone] Accepts Medicaid  °[facility] [address]  °[place] 1-800-659-3381 Substance Abuse/Addiction Treatment  ° °[place] Behavioral Health Resources °Organization         Address  Phone  Notes  °[facility]  ([phone]) [phone]   °Julie Brannon, PhD [address] [place], [place]   ([phone]) [phone] or (336) [phone]   °[facility]   [address] °[place], [place] (336) 349-4454   °[facility] [address], [place], [place] (336) [phone] Insurance/Medicaid/sponsorship through [facility]  °[facility] [address]                                    [place], [place] [phone]) [phone] Therapy/tele-psych/case  °[facility] [address]  ° [place], [place] ([phone]) [phone]    °Dr. Arfeen  ([phone]) [phone]   °[facility] [place]  [employer] [place] [facility] 1) [address], [place] °2) [address], [place] °3)  [address], [place] (336) 349-3220 °(336) 342-7768 ° °(336) [phone]   °[place] Child Abuse Hotline (336) 342-1394 or (336) [phone] (After Hours)    ° ° ° °

## 2014-06-21 NOTE — ED Notes (Signed)
Patient transported to CT 

## 2014-12-21 ENCOUNTER — Other Ambulatory Visit: Payer: Self-pay | Admitting: Orthopaedic Surgery

## 2014-12-21 DIAGNOSIS — M25572 Pain in left ankle and joints of left foot: Secondary | ICD-10-CM

## 2014-12-26 ENCOUNTER — Other Ambulatory Visit: Payer: Self-pay

## 2014-12-27 ENCOUNTER — Ambulatory Visit
Admission: RE | Admit: 2014-12-27 | Discharge: 2014-12-27 | Disposition: A | Discharge status: Home / Self Care | Payer: 59 | Source: Ambulatory Visit | Attending: Orthopaedic Surgery | Admitting: Orthopaedic Surgery

## 2014-12-27 DIAGNOSIS — M25572 Pain in left ankle and joints of left foot: Secondary | ICD-10-CM

## 2015-01-13 ENCOUNTER — Other Ambulatory Visit: Payer: Self-pay | Admitting: Obstetrics and Gynecology

## 2015-01-13 DIAGNOSIS — N6320 Unspecified lump in the left breast, unspecified quadrant: Secondary | ICD-10-CM

## 2015-01-13 DIAGNOSIS — N644 Mastodynia: Secondary | ICD-10-CM

## 2015-01-13 DIAGNOSIS — Z803 Family history of malignant neoplasm of breast: Secondary | ICD-10-CM

## 2015-01-18 ENCOUNTER — Other Ambulatory Visit: Payer: Self-pay | Admitting: Obstetrics and Gynecology

## 2015-01-18 DIAGNOSIS — N6320 Unspecified lump in the left breast, unspecified quadrant: Secondary | ICD-10-CM

## 2015-01-18 DIAGNOSIS — N644 Mastodynia: Secondary | ICD-10-CM

## 2015-01-18 DIAGNOSIS — Z803 Family history of malignant neoplasm of breast: Secondary | ICD-10-CM

## 2015-01-24 ENCOUNTER — Other Ambulatory Visit: Payer: 59

## 2015-03-03 ENCOUNTER — Other Ambulatory Visit: Payer: 59

## 2015-07-08 ENCOUNTER — Other Ambulatory Visit: Payer: 59

## 2015-07-25 ENCOUNTER — Inpatient Hospital Stay (HOSPITAL_COMMUNITY)
Admission: AD | Admit: 2015-07-25 | Discharge: 2015-07-25 | Disposition: A | Discharge status: Home / Self Care | Payer: 59 | Source: Ambulatory Visit | Attending: Obstetrics | Admitting: Obstetrics

## 2015-07-25 DIAGNOSIS — N946 Dysmenorrhea, unspecified: Secondary | ICD-10-CM | POA: Insufficient documentation

## 2015-07-25 DIAGNOSIS — F319 Bipolar disorder, unspecified: Secondary | ICD-10-CM | POA: Insufficient documentation

## 2015-07-25 DIAGNOSIS — N939 Abnormal uterine and vaginal bleeding, unspecified: Secondary | ICD-10-CM | POA: Diagnosis present

## 2015-07-25 DIAGNOSIS — Z87891 Personal history of nicotine dependence: Secondary | ICD-10-CM | POA: Diagnosis not present

## 2015-07-25 LAB — URINE MICROSCOPIC-ADD ON

## 2015-07-25 LAB — URINALYSIS, ROUTINE W REFLEX MICROSCOPIC
Bilirubin Urine: NEGATIVE
Glucose, UA: NEGATIVE mg/dL
Ketones, ur: 15 mg/dL — AB
Leukocytes, UA: NEGATIVE
NITRITE: NEGATIVE
PROTEIN: NEGATIVE mg/dL
SPECIFIC GRAVITY, URINE: 1.025 (ref 1.005–1.030)
pH: 6 (ref 5.0–8.0)

## 2015-07-25 LAB — POCT PREGNANCY, URINE: PREG TEST UR: NEGATIVE

## 2015-07-25 NOTE — MAU Provider Note (Signed)
History     CSN: [id number]  Arrival date and time: 07/25/15 1734   None     Chief Complaint  Patient presents with  . Abdominal Pain  . Back Pain  . Vaginal Bleeding   Vaginal Bleeding The patient's primary symptoms include pelvic pain and vaginal bleeding. This is a new problem. The problem occurs intermittently. The problem has been unchanged. Pain severity now: 9/10  The problem affects both sides. The vaginal discharge was bloody. The vaginal bleeding is spotting. She has not been passing clots. She has not been passing tissue. Nothing aggravates the symptoms. She has tried NSAIDs for the symptoms. The treatment provided no relief. It is unknown whether or not her partner has an STD. She uses nothing for contraception. Menstrual history: LMP 07/06/15.     Past Medical History  Diagnosis Date  . Bipolar 1 disorder   . STI (sexually transmitted infection)     Past Surgical History  Procedure Laterality Date  . No past surgeries    . Orif ankle fracture  04/02/2012    Procedure: OPEN REDUCTION INTERNAL FIXATION (ORIF) ANKLE FRACTURE;  Surgeon: Javier DockerJeffrey C Beane, MD;  Location: [facility] OR;  Service: Orthopedics;  Laterality: Left;  . I&d extremity  04/02/2012    Procedure: IRRIGATION AND DEBRIDEMENT EXTREMITY;  Surgeon: Javier DockerJeffrey C Beane, MD;  Location: [facility] OR;  Service: Orthopedics;  Laterality: Left;  . I&d extremity  04/04/2012    Procedure: IRRIGATION AND DEBRIDEMENT EXTREMITY;  Surgeon: Javier DockerJeffrey C Beane, MD;  Location: [facility] OR;  Service: Orthopedics;  Laterality: Left;    Family History  Problem Relation Age of Onset  . Diabetes Father   . Stroke Father   . Fibromyalgia Mother     Social History  Substance Use Topics  . Smoking status: Former Games developermoker  . Smokeless tobacco: Former NeurosurgeonUser  . Alcohol Use: Yes     Comment: pt states she drinks a few times a week    Allergies:  Allergies  Allergen Reactions  . Apple Juice Nausea And Vomiting and Other (See Comments)    Eyes  swell    Prescriptions prior to admission  Medication Sig Dispense Refill Last Dose  . azithromycin (ZITHROMAX) 250 MG tablet Take 4 tablets (1,000 mg total) by mouth once. Take first 2 tablets together, then 1 every day until finished. (Patient not taking: Reported on 06/21/2014) 4 tablet 0 Completed Course at Unknown time  . cyclobenzaprine (FLEXERIL) 10 MG tablet Take 1 tablet (10 mg total) by mouth 2 (two) times daily as needed for muscle spasms. 20 tablet 0   . HYDROcodone-acetaminophen (NORCO/VICODIN) 5-325 MG per tablet Take 2 tablets by mouth every 4 (four) hours as needed for moderate pain. (Patient not taking: Reported on 06/21/2014) 10 tablet 0 Completed Course at Unknown time  . ibuprofen (ADVIL,MOTRIN) 800 MG tablet Take 1 tablet (800 mg total) by mouth 3 (three) times daily. 21 tablet 0 06/21/2014 at Unknown time  . ibuprofen (ADVIL,MOTRIN) 800 MG tablet Take 1 tablet (800 mg total) by mouth 3 (three) times daily. 21 tablet 0   . ondansetron (ZOFRAN) 4 MG tablet Take 1 tablet (4 mg total) by mouth every 6 (six) hours. (Patient not taking: Reported on 06/21/2014) 12 tablet 0 Completed Course at Unknown time    Review of Systems  Genitourinary: Positive for vaginal bleeding and pelvic pain.   Physical Exam   Blood pressure 130/88, pulse 78, temperature 98.5 F (36.9 C), temperature source Oral, resp. rate 16, height   (1.702 m), weight 105.507 kg (232 lb 9.6 oz), last menstrual period 07/06/2015.  Physical Exam  Nursing note and vitals reviewed. Constitutional: She is oriented to person, place, and time. She appears well-developed and well-nourished. No distress.  HENT:  Head: Normocephalic.  Cardiovascular: Normal rate.   Respiratory: Effort normal.  Musculoskeletal: Normal range of motion.  Neurological: She is alert and oriented to person, place, and time.  Psychiatric: She has a normal mood and affect.     Results for orders placed or performed during the hospital  encounter of 07/25/15 (from the past 24 hour(s))  Urinalysis, Routine w reflex microscopic (not at [facility])     Status: Abnormal   Collection Time: 07/25/15  6:25 PM  Result Value Ref Range   Color, Urine YELLOW YELLOW   APPearance CLEAR CLEAR   Specific Gravity, Urine 1.025 1.005 - 1.030   pH 6.0 5.0 - 8.0   Glucose, UA NEGATIVE NEGATIVE mg/dL   Hgb urine dipstick LARGE (A) NEGATIVE   Bilirubin Urine NEGATIVE NEGATIVE   Ketones, ur 15 (A) NEGATIVE mg/dL   Protein, ur NEGATIVE NEGATIVE mg/dL   Nitrite NEGATIVE NEGATIVE   Leukocytes, UA NEGATIVE NEGATIVE  Urine microscopic-add on     Status: Abnormal   Collection Time: 07/25/15  6:25 PM  Result Value Ref Range   Squamous Epithelial / LPF 0-5 (A) NONE SEEN   WBC, UA 0-5 0 - 5 WBC/hpf   RBC / HPF 6-30 0 - 5 RBC/hpf   Bacteria, UA FEW (A) NONE SEEN   Urine-Other MUCOUS PRESENT   Pregnancy, urine POC     Status: None   Collection Time: 07/25/15  7:02 PM  Result Value Ref Range   Preg Test, Ur NEGATIVE NEGATIVE    MAU Course  Procedures  MDM 2132: D/W Dr. Chestine Spore, ok with plan   Assessment and Plan   1. Dysmenorrhea    DC home Comfort measures reviewed  RX: none  Return to MAU as needed FU with OB as planned  Follow-up Information    Follow up with [facility] Lizabeth Leyden, MD.   Specialty:  Obstetrics   Why:  If symptoms worsen   Contact information:   [address] [place] [address] [place] [address] [phone]         Tawnya Crook 07/25/2015, 9:28 PM

## 2015-07-25 NOTE — MAU Note (Signed)
Urine in lab 

## 2015-07-25 NOTE — Discharge Instructions (Signed)

## 2015-07-25 NOTE — MAU Note (Signed)
Having like what feels like menstrual cramps, but having sharp pains throughout abd and back; started around 1100 this morning. Already had cycle this month, around 1500 saw pinkish when wiped after voiding, noted a couple small clots in the toilet

## 2015-10-25 ENCOUNTER — Emergency Department (EMERGENCY_DEPARTMENT_HOSPITAL)
Admit: 2015-10-25 | Discharge: 2015-10-25 | Disposition: A | Discharge status: Home / Self Care | Payer: Managed Care, Other (non HMO)

## 2015-10-25 ENCOUNTER — Emergency Department (HOSPITAL_COMMUNITY)
Admission: EM | Admit: 2015-10-25 | Discharge: 2015-10-25 | Disposition: A | Discharge status: Home / Self Care | Payer: Managed Care, Other (non HMO) | Attending: Emergency Medicine | Admitting: Emergency Medicine

## 2015-10-25 ENCOUNTER — Encounter (HOSPITAL_COMMUNITY): Payer: Self-pay

## 2015-10-25 ENCOUNTER — Emergency Department (HOSPITAL_COMMUNITY): Payer: Managed Care, Other (non HMO)

## 2015-10-25 DIAGNOSIS — M79606 Pain in leg, unspecified: Secondary | ICD-10-CM

## 2015-10-25 DIAGNOSIS — F319 Bipolar disorder, unspecified: Secondary | ICD-10-CM | POA: Insufficient documentation

## 2015-10-25 DIAGNOSIS — Z79899 Other long term (current) drug therapy: Secondary | ICD-10-CM | POA: Diagnosis not present

## 2015-10-25 DIAGNOSIS — Z792 Long term (current) use of antibiotics: Secondary | ICD-10-CM | POA: Insufficient documentation

## 2015-10-25 DIAGNOSIS — Z791 Long term (current) use of non-steroidal anti-inflammatories (NSAID): Secondary | ICD-10-CM | POA: Diagnosis not present

## 2015-10-25 DIAGNOSIS — M25472 Effusion, left ankle: Secondary | ICD-10-CM

## 2015-10-25 DIAGNOSIS — Z87891 Personal history of nicotine dependence: Secondary | ICD-10-CM | POA: Diagnosis not present

## 2015-10-25 DIAGNOSIS — M25562 Pain in left knee: Secondary | ICD-10-CM | POA: Diagnosis present

## 2015-10-25 LAB — I-STAT BETA HCG BLOOD, ED (MC, WL, AP ONLY)

## 2015-10-25 MED ORDER — HYDROCODONE-ACETAMINOPHEN 5-325 MG PO TABS: ORAL_TABLET | ORAL | Status: DC

## 2015-10-25 MED ORDER — HYDROCODONE-ACETAMINOPHEN 5-325 MG PO TABS
1.0000 | ORAL_TABLET | Freq: Once | ORAL | Status: AC
  Administered 2015-10-25: 1 via ORAL
  Filled 2015-10-25: qty 1

## 2015-10-25 NOTE — Progress Notes (Signed)
*  Preliminary Results* Left lower extremity venous duplex completed. Left lower extremity is negative for deep vein thrombosis. There is no evidence of left Baker's cyst.  10/25/2015 6:43 PM  Gertie FeyMichelle Fritzi Scripter, RVT, RDCS, RDMS

## 2015-10-25 NOTE — ED Notes (Signed)
Pt here with ankle pain.  Pt had ankle repair in Nov 2015.  Pt states for three days her knee/ankle have been hurting.

## 2015-10-25 NOTE — ED Notes (Signed)
Pt requested x-ray.  Joni ReiningNicole, [place] explained why a x-ray was not medically indicated but the patient was welcome to request one.
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
## 2015-10-25 NOTE — ED Notes (Signed)
Pt ambulatory and independent at discharge.  

## 2015-10-25 NOTE — Discharge Instructions (Signed)
Rest, Ice intermittently (in the first 24-48 hours), Gentle compression with an Ace wrap, and elevate (Limb above the level of the heart) °  °Take up to 800mg of ibuprofen (that is usually 4 over the counter pills)  3 times a day for 5 days. Take with food. ° °Take vicodin for breakthrough pain, do not drink alcohol, drive, care for children or do other critical tasks while taking vicodin. ° °
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
## 2015-12-29 ENCOUNTER — Ambulatory Visit (INDEPENDENT_AMBULATORY_CARE_PROVIDER_SITE_OTHER): Payer: Managed Care, Other (non HMO) | Admitting: Family Medicine

## 2015-12-29 ENCOUNTER — Ambulatory Visit: Payer: Managed Care, Other (non HMO) | Admitting: Family Medicine

## 2015-12-29 ENCOUNTER — Encounter: Payer: Self-pay | Admitting: Family Medicine

## 2015-12-29 VITALS — BP 122/72 | HR 114 | Temp 98.2°F | Ht 66.5 in | Wt 233.9 lb

## 2015-12-29 DIAGNOSIS — Z7189 Other specified counseling: Secondary | ICD-10-CM | POA: Diagnosis not present

## 2015-12-29 DIAGNOSIS — R5383 Other fatigue: Secondary | ICD-10-CM

## 2015-12-29 DIAGNOSIS — F902 Attention-deficit hyperactivity disorder, combined type: Secondary | ICD-10-CM | POA: Insufficient documentation

## 2015-12-29 DIAGNOSIS — Z7689 Persons encountering health services in other specified circumstances: Secondary | ICD-10-CM

## 2015-12-29 DIAGNOSIS — Z0001 Encounter for general adult medical examination with abnormal findings: Secondary | ICD-10-CM | POA: Diagnosis not present

## 2015-12-29 DIAGNOSIS — F3181 Bipolar II disorder: Secondary | ICD-10-CM

## 2015-12-29 LAB — POCT URINE PREGNANCY: Preg Test, Ur: NEGATIVE

## 2015-12-29 NOTE — Progress Notes (Signed)
Pre visit review using our clinic review tool, if applicable. No additional management support is needed unless otherwise documented below in the visit note. 
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
## 2015-12-29 NOTE — Patient Instructions (Signed)
Please make an appointment with the lab for lab work.   We have ordered labs or studies at this visit. It can take up to 1-2 weeks for results and processing. IF results require follow up or explanation, we will call you with instructions. Clinically stable results will be released to your [facility]. If you have not heard from us or cannot find your results in [facility] in 2 weeks please contact our office at [phone].  If you are not yet signed up for [facility], please consider signing up   Recommend that you follow up with your gynecologist as directed.   Your next appointment will be determined based upon your lab results.  Fatigue Fatigue is feeling tired all of the time, a lack of energy, or a lack of motivation. Occasional or mild fatigue is often a normal response to activity or life in general. However, long-lasting (chronic) or extreme fatigue may indicate an underlying medical condition. HOME CARE INSTRUCTIONS  Watch your fatigue for any changes. The following actions may help to lessen any discomfort you are feeling:  Talk to your health care provider about how much sleep you need each night. Try to get the required amount every night.  Take medicines only as directed by your health care provider.  Eat a healthy and nutritious diet. Ask your health care provider if you need help changing your diet.  Drink enough fluid to keep your urine clear or pale yellow.  Practice ways of relaxing, such as yoga, meditation, massage therapy, or acupuncture.  Exercise regularly.   Change situations that cause you stress. Try to keep your work and personal routine reasonable.  Do not abuse illegal drugs.  Limit alcohol intake to no more than 1 drink per day for nonpregnant women and 2 drinks per day for men. One drink equals 12 ounces of beer, 5 ounces of wine, or 1 ounces of hard liquor.  Take a multivitamin, if directed by your health care provider. SEEK MEDICAL CARE IF:   Your  fatigue does not get better.  You have a fever.   You have unintentional weight loss or gain.  You have headaches.   You have difficulty:   Falling asleep.  Sleeping throughout the night.  You feel angry, guilty, anxious, or sad.   You are unable to have a bowel movement (constipation).   You skin is dry.   Your legs or another part of your body is swollen.  SEEK IMMEDIATE MEDICAL CARE IF:   You feel confused.   Your vision is blurry.  You feel faint or pass out.   You have a severe headache.   You have severe abdominal, pelvic, or back pain.   You have chest pain, shortness of breath, or an irregular or fast heartbeat.   You are unable to urinate or you urinate less than normal.   You develop abnormal bleeding, such as bleeding from the rectum, vagina, nose, lungs, or nipples.  You vomit blood.   You have thoughts about harming yourself or committing suicide.   You are worried that you might harm someone else.    This information is not intended to replace advice given to you by your health care provider. Make sure you discuss any questions you have with your health care provider.   Document Released: 02/18/2007 Document Revised: 05/14/2014 Document Reviewed: 08/25/2013 Elsevier Interactive Patient Education [employer]2016 Elsevier Inc.

## 2015-12-30 ENCOUNTER — Other Ambulatory Visit (INDEPENDENT_AMBULATORY_CARE_PROVIDER_SITE_OTHER): Payer: Managed Care, Other (non HMO)

## 2015-12-30 DIAGNOSIS — R6889 Other general symptoms and signs: Secondary | ICD-10-CM | POA: Diagnosis not present

## 2015-12-30 DIAGNOSIS — Z0001 Encounter for general adult medical examination with abnormal findings: Secondary | ICD-10-CM

## 2015-12-30 DIAGNOSIS — R5383 Other fatigue: Secondary | ICD-10-CM

## 2015-12-30 LAB — CBC WITH DIFFERENTIAL/PLATELET
BASOS PCT: 0.3 % (ref 0.0–3.0)
Basophils Absolute: 0 10*3/uL (ref 0.0–0.1)
EOS PCT: 1.1 % (ref 0.0–5.0)
Eosinophils Absolute: 0.1 10*3/uL (ref 0.0–0.7)
HCT: 39 % (ref 36.0–46.0)
Hemoglobin: 13.4 g/dL (ref 12.0–15.0)
LYMPHS ABS: 2.7 10*3/uL (ref 0.7–4.0)
Lymphocytes Relative: 46 % (ref 12.0–46.0)
MCHC: 34.4 g/dL (ref 30.0–36.0)
MCV: 92.7 fl (ref 78.0–100.0)
MONO ABS: 0.3 10*3/uL (ref 0.1–1.0)
Monocytes Relative: 5.5 % (ref 3.0–12.0)
NEUTROS PCT: 47.1 % (ref 43.0–77.0)
Neutro Abs: 2.8 10*3/uL (ref 1.4–7.7)
Platelets: 379 10*3/uL (ref 150.0–400.0)
RBC: 4.2 Mil/uL (ref 3.87–5.11)
RDW: 13.5 % (ref 11.5–15.5)
WBC: 5.9 10*3/uL (ref 4.0–10.5)

## 2015-12-30 LAB — BASIC METABOLIC PANEL
BUN: 7 mg/dL (ref 6–23)
CALCIUM: 9 mg/dL (ref 8.4–10.5)
CO2: 28 mEq/L (ref 19–32)
Chloride: 105 mEq/L (ref 96–112)
Creatinine, Ser: 0.56 mg/dL (ref 0.40–1.20)
GFR: 167.91 mL/min (ref >60.00)
Glucose, Bld: 84 mg/dL (ref 70–99)
Potassium: 4.4 mEq/L (ref 3.5–5.1)
SODIUM: 138 meq/L (ref 135–145)

## 2015-12-30 LAB — HEPATIC FUNCTION PANEL
ALK PHOS: 53 U/L (ref 39–117)
ALT: 8 U/L (ref 0–35)
AST: 9 U/L (ref 0–37)
Albumin: 3.8 g/dL (ref 3.5–5.2)
BILIRUBIN DIRECT: 0.1 mg/dL (ref 0.0–0.3)
TOTAL PROTEIN: 6.8 g/dL (ref 6.0–8.3)
Total Bilirubin: 0.5 mg/dL (ref 0.2–1.2)

## 2015-12-30 LAB — TSH: TSH: 0.43 u[IU]/mL (ref 0.35–4.50)

## 2015-12-30 LAB — LIPID PANEL
CHOLESTEROL: 140 mg/dL (ref 0–200)
HDL: 53.6 mg/dL (ref >39.00)
LDL Cholesterol: 74 mg/dL (ref 0–99)
NONHDL: 86.68
TRIGLYCERIDES: 63 mg/dL (ref 0.0–149.0)
Total CHOL/HDL Ratio: 3
VLDL: 12.6 mg/dL (ref 0.0–40.0)

## 2015-12-30 LAB — HCG, QUANTITATIVE, PREGNANCY: Quantitative HCG: 0 m[IU]/mL

## 2016-01-24 ENCOUNTER — Other Ambulatory Visit: Payer: Self-pay | Admitting: Obstetrics and Gynecology

## 2016-01-25 LAB — CYTOLOGY - PAP

## 2016-08-14 ENCOUNTER — Encounter: Payer: Self-pay | Admitting: Family Medicine

## 2017-01-01 ENCOUNTER — Telehealth: Payer: Self-pay | Admitting: Family Medicine

## 2017-01-01 NOTE — Telephone Encounter (Signed)
Patient Name: Nichole Page  DOB: [birth year]    Initial Comment caller has had high blood pressure dizzy for 2 days feet and hands are swollen. She's had a migraine.pressure is 135/90   Nurse Assessment  Nurse: Alvera Singh, RN, Bonita Quin Date/Time (Eastern Time): 01/01/2017 4:08:55 PM  Confirm and document reason for call. If symptomatic, describe symptoms. ---Caller says she has been dizzy for 2 days and not been able to keep anything down. She is also having a severe headache. Noise and light are irritating. Having tingling in hands a feet and swelling.  Does the patient have any new or worsening symptoms? ---Yes  Will a triage be completed? ---Yes  Related visit to physician within the last 2 weeks? ---No  Does the PT have any chronic conditions? (i.e. diabetes, asthma, etc.) ---Yes  List chronic conditions. ---Arthritis  Is the patient pregnant or possibly pregnant? (Ask all females between the ages of 39-55) ---No  Is this a behavioral health or substance abuse call? ---No     Guidelines    Guideline Title Affirmed Question Affirmed Notes  Dizziness - Lightheadedness Extra heart beats OR irregular heart beating (i.e., "palpitations")    Final Disposition User   Go to ED Now (or PCP triage) Alvera Singh, RN, [facility]    Referrals  [facility] - ED   Disagree/Comply: Comply

## 2017-01-02 NOTE — Telephone Encounter (Signed)
Patient not evaluated in ED  Spoke with pt and she states that she is feeling much better today. She would like to schedule appt to see Amil AmenJulia for a thorough exam and BP issues. Appt scheduled, pt aware. Nothing further needed at this time.

## 2017-01-04 ENCOUNTER — Ambulatory Visit: Payer: Managed Care, Other (non HMO) | Admitting: Family Medicine

## 2017-01-24 ENCOUNTER — Encounter: Payer: Self-pay | Admitting: Family Medicine

## 2017-03-25 ENCOUNTER — Ambulatory Visit (INDEPENDENT_AMBULATORY_CARE_PROVIDER_SITE_OTHER): Payer: Self-pay | Admitting: Orthopaedic Surgery

## 2017-04-24 ENCOUNTER — Other Ambulatory Visit: Payer: Self-pay | Admitting: Obstetrics and Gynecology

## 2017-04-24 DIAGNOSIS — N644 Mastodynia: Secondary | ICD-10-CM

## 2017-04-24 DIAGNOSIS — N6320 Unspecified lump in the left breast, unspecified quadrant: Secondary | ICD-10-CM

## 2017-05-01 ENCOUNTER — Ambulatory Visit
Admission: RE | Admit: 2017-05-01 | Discharge: 2017-05-01 | Disposition: A | Discharge status: Home / Self Care | Payer: Managed Care, Other (non HMO) | Source: Ambulatory Visit | Attending: Obstetrics and Gynecology | Admitting: Obstetrics and Gynecology

## 2017-05-01 DIAGNOSIS — N644 Mastodynia: Secondary | ICD-10-CM

## 2017-05-01 DIAGNOSIS — N6320 Unspecified lump in the left breast, unspecified quadrant: Secondary | ICD-10-CM

## 2017-12-17 ENCOUNTER — Institutional Professional Consult (permissible substitution): Payer: Managed Care, Other (non HMO) | Admitting: Pulmonary Disease

## 2018-01-24 ENCOUNTER — Ambulatory Visit (INDEPENDENT_AMBULATORY_CARE_PROVIDER_SITE_OTHER): Payer: Managed Care, Other (non HMO) | Admitting: Family

## 2018-01-24 ENCOUNTER — Other Ambulatory Visit: Payer: Self-pay

## 2018-01-24 ENCOUNTER — Encounter: Payer: Self-pay | Admitting: Family

## 2018-01-24 VITALS — BP 132/89 | HR 86 | Temp 98.5°F | Ht 66.0 in | Wt 243.6 lb

## 2018-01-24 DIAGNOSIS — B9689 Other specified bacterial agents as the cause of diseases classified elsewhere: Secondary | ICD-10-CM

## 2018-01-24 DIAGNOSIS — Z124 Encounter for screening for malignant neoplasm of cervix: Secondary | ICD-10-CM | POA: Diagnosis not present

## 2018-01-24 DIAGNOSIS — Z01419 Encounter for gynecological examination (general) (routine) without abnormal findings: Secondary | ICD-10-CM | POA: Diagnosis not present

## 2018-01-24 DIAGNOSIS — N760 Acute vaginitis: Secondary | ICD-10-CM | POA: Diagnosis not present

## 2018-01-24 DIAGNOSIS — Z1151 Encounter for screening for human papillomavirus (HPV): Secondary | ICD-10-CM | POA: Diagnosis not present

## 2018-01-24 DIAGNOSIS — Z113 Encounter for screening for infections with a predominantly sexual mode of transmission: Secondary | ICD-10-CM | POA: Diagnosis not present

## 2018-01-24 DIAGNOSIS — Z1389 Encounter for screening for other disorder: Secondary | ICD-10-CM | POA: Diagnosis not present

## 2018-01-24 NOTE — Patient Instructions (Signed)
Plan: 1)  [facility] [phone] [address], [place], [place] [address]  2) Schedule primary care appointment Lucianne Lei, MD  Fibrocystic Breast Changes Fibrocystic breast changes are changes in breast tissue that can cause breasts to become swollen, lumpy, or painful. This can happen due to buildup of scar-like tissue (fibrous tissue) or the forming of fluid-filled lumps (cysts) in the breast. This is a common condition, and it is not cancerous (is benign). The exact cause is not known, but it seems to occur when women go through hormonal changes during their menstrual cycle. Fibrocystic breast changes can affect one or both breasts. What are the causes? The exact cause of fibrocystic breast changes is not known. However, this condition:  May be related to the female hormones estrogen and progesterone.  May be influenced by family traits that get passed from parent to child (genetics).  What are the signs or symptoms? Symptoms of this condition may affect one or both breasts, and may include:  Tenderness, mild discomfort, or pain.  Swelling.  Rope-like tissue that can be felt when touching the breast.  Lumps in one or both breasts.  Changes in breast size. Breasts may get larger before the menstrual period and smaller after the menstrual period.  Green or dark brown discharge from the nipple.  Symptoms are usually worse before menstrual periods start, and they get better toward the end of menstrual periods. How is this diagnosed? This condition is diagnosed based on your medical history and a physical exam of your breasts. You may also have tests, such as:  A breast X-ray (mammogram).  Ultrasound of your breasts.  MRI.  Removal of a breast tissue sample for testing (breast biopsy). This may be done if your health care provider thinks that something else may be causing changes in your breasts.  How is this treated? Often, treatment is not  needed for this condition. In some cases, treatment may include:  Taking over-the-counter pain relievers to help lessen pain or discomfort.  Limiting or avoiding caffeine. Foods and beverages that contain caffeine include chocolate, soda, coffee, and tea.  Reducing sugar and fat in your diet.  Your health care provider may also recommend:  A procedure to remove fluid from a cyst that is causing pain (fine needle aspiration).  Surgery to remove a cyst that is large or tender or does not go away.  Follow these instructions at home:  Examine your breasts after every menstrual period. If you do not have menstrual periods, check your breasts on the first day of every month. Feel for changes in your breasts, such as: ? More tenderness. ? A new growth. ? A change in size. ? A change in an existing lump.  Take over-the-counter and prescription medicines only as told by your health care provider.  Wear a well-fitted support or sports bra, especially when exercising.  Decrease or avoid caffeine, fat, and sugar in your diet as directed by your health care provider. Contact a health care provider if:  You have fluid leaking from your nipple, especially if it is bloody.  You have new lumps or bumps in your breast.  Your breast becomes enlarged, red, and painful.  You have areas of your breast that pucker inward.  Your nipple appears flat or indented. Get help right away if:  You have redness of your breast and the redness is spreading. Summary  Fibrocystic breast changes are changes in breast tissue that can cause breasts to become swollen, lumpy,  or painful.  This condition may be related to the female hormones estrogen and progesterone.  With this condition, it is important to examine your breasts after every menstrual period. If you do not have menstrual periods, check your breasts on the first day of every month. This information is not intended to replace advice given to you  by your health care provider. Make sure you discuss any questions you have with your health care provider. Document Released: 02/07/2006 Document Revised: 01/03/2016 Document Reviewed: 12/21/2015 Elsevier Interactive Patient Education  2017 [place] 18-39 Years, Female Preventive care refers to lifestyle choices and visits with your health care provider that can promote health and wellness. What does preventive care include?  A yearly physical exam. This is also called an annual well check.  Dental exams once or twice a year.  Routine eye exams. Ask your health care provider how often you should have your eyes checked.  Personal lifestyle choices, including: ? Daily care of your teeth and gums. ? Regular physical activity. ? Eating a healthy diet. ? Avoiding tobacco and drug use. ? Limiting alcohol use. ? Practicing safe sex. ? Taking vitamin and mineral supplements as recommended by your health care provider. What happens during an annual well check? The services and screenings done by your health care provider during your annual well check will depend on your age, overall health, lifestyle risk factors, and family history of disease. Counseling Your health care provider may ask you questions about your:  Alcohol use.  Tobacco use.  Drug use.  Emotional well-being.  Home and relationship well-being.  Sexual activity.  Eating habits.  Work and work Statistician.  Method of birth control.  Menstrual cycle.  Pregnancy history.  Screening You may have the following tests or measurements:  Height, weight, and BMI.  Diabetes screening. This is done by checking your blood sugar (glucose) after you have not eaten for a while (fasting).  Blood pressure.  Lipid and cholesterol levels. These may be checked every 5 years starting at age 66.  Skin check.  Hepatitis C blood test.  Hepatitis B blood test.  Sexually transmitted disease (STD)  testing.  BRCA-related cancer screening. This may be done if you have a family history of breast, ovarian, tubal, or peritoneal cancers.  Pelvic exam and Pap test. This may be done every 3 years starting at age 74. Starting at age 38, this may be done every 5 years if you have a Pap test in combination with an HPV test.  Discuss your test results, treatment options, and if necessary, the need for more tests with your health care provider. Vaccines Your health care provider may recommend certain vaccines, such as:  Influenza vaccine. This is recommended every year.  Tetanus, diphtheria, and acellular pertussis (Tdap, Td) vaccine. You may need a Td booster every 10 years.  Varicella vaccine. You may need this if you have not been vaccinated.  HPV vaccine. If you are 63 or younger, you may need three doses over 6 months.  Measles, mumps, and rubella (MMR) vaccine. You may need at least one dose of MMR. You may also need a second dose.  Pneumococcal 13-valent conjugate (PCV13) vaccine. You may need this if you have certain conditions and were not previously vaccinated.  Pneumococcal polysaccharide (PPSV23) vaccine. You may need one or two doses if you smoke cigarettes or if you have certain conditions.  Meningococcal vaccine. One dose is recommended if you are age 51-21 years and  a Market researcher living in a residence hall, or if you have one of several medical conditions. You may also need additional booster doses.  Hepatitis A vaccine. You may need this if you have certain conditions or if you travel or work in places where you may be exposed to hepatitis A.  Hepatitis B vaccine. You may need this if you have certain conditions or if you travel or work in places where you may be exposed to hepatitis B.  Haemophilus influenzae type b (Hib) vaccine. You may need this if you have certain risk factors.  Talk to your health care provider about which screenings and vaccines you  need and how often you need them. This information is not intended to replace advice given to you by your health care provider. Make sure you discuss any questions you have with your health care provider. Document Released: 06/19/2001 Document Revised: 01/11/2016 Document Reviewed: 02/22/2015 Elsevier Interactive Patient Education  [employer].

## 2018-01-25 LAB — HEPATITIS B SURFACE ANTIGEN: HEP B S AG: NEGATIVE

## 2018-01-25 LAB — RPR: RPR Ser Ql: NONREACTIVE

## 2018-01-25 LAB — HIV ANTIBODY (ROUTINE TESTING W REFLEX): HIV Screen 4th Generation wRfx: NONREACTIVE

## 2018-01-25 NOTE — Progress Notes (Signed)
Subjective:     Nichole Page is a 27 y.o. female here for a routine exam.   Reviewed social history.  Recently ended relationship with partner of 2 years.  After that ended did engage in sexual activity with another partner and desires STI screening.  Desiring to not be in a relationship at this time.  Current complaints: vaginal discharge, no itching.  Shared past experiences of depression.  Not currently seeing a counselor, however, desires to be connected to a new resource.     Gynecologic History Patient's last menstrual period was 01/06/2018 (approximate). Contraception: none; does not intend to get pregnant in next year, but would be okay if it happened Last Pap: 2-3 years ago. Results were: normal   Obstetric History OB History  Gravida Para Term Preterm AB Living  1 1 1     1   SAB TAB Ectopic Multiple Live Births          1    # Outcome Date GA Lbr Len/2nd Weight Sex Delivery Anes PTL Lv  1 Term 11/25/04    F Vag-Spont   LIV     The following portions of the patient's history were reviewed and updated as appropriate: allergies, current medications, past family history, past medical history, past social history, past surgical history and problem list.  Review of Systems Pertinent items are noted in HPI.    Objective:   BP 132/89 (BP Location: Right Arm, Patient Position: Sitting, Cuff Size: Large)   Pulse 86   Temp 98.5 F (36.9 C) (Oral)   Ht 5\' 6"  (1.676 m)   Wt 243 lb 9.6 oz (110.5 kg)   LMP 01/06/2018 (Approximate)   BMI 39.32 kg/m    General appearance: alert, cooperative and appears stated age Head: Normocephalic, without obvious abnormality, atraumatic Neck: no adenopathy, no carotid bruit, no JVD, supple, symmetrical, trachea midline and thyroid not enlarged, symmetric, no tenderness/mass/nodules Lungs: clear to auscultation bilaterally Breasts: normal appearance, no masses or tenderness, No nipple retraction or dimpling, No nipple discharge or  bleeding, No axillary or supraclavicular adenopathy, Normal to palpation without dominant masses,  Heart: regular rate and rhythm, S1, S2 normal, no murmur, click, rub or gallop Abdomen: soft, non-tender; bowel sounds normal; no masses,  no organomegaly Pelvic: cervix normal in appearance, external genitalia normal, no adnexal masses or tenderness, no cervical motion tenderness, rectovaginal septum normal, uterus normal size, shape, and consistency and vagina with yellowish, mucus-like discharge, no odor  Skin: Skin color, texture, turgor normal. No rashes or lesions    Assessment:   Well Woman Exam STI Screening Hx of Depression   Plan:    Education reviewed: depression evaluation and safe sex/STD prevention.  \ Referral to counseling services STI screening (Hep B, RPR, HIV, GC/CT, wet prep) Pap smear collected  Nichole Page, Nichole Page N, CNM

## 2018-01-28 LAB — CYTOLOGY - PAP
Bacterial vaginitis: POSITIVE — AB
CANDIDA VAGINITIS: NEGATIVE
CHLAMYDIA, DNA PROBE: NEGATIVE
Diagnosis: NEGATIVE
HPV: NOT DETECTED
Neisseria Gonorrhea: NEGATIVE
TRICH (WINDOWPATH): NEGATIVE

## 2018-01-30 MED ORDER — METRONIDAZOLE 500 MG PO TABS: 500.0000 mg | ORAL_TABLET | Freq: Two times a day (BID) | ORAL | 0 refills | Status: DC

## 2018-01-30 NOTE — Addendum Note (Signed)
Addended by: Marlis Edelson on: 01/30/2018 04:58 PM   Modules accepted: Orders

## 2018-01-30 NOTE — Progress Notes (Signed)
MyChart message sent to patient regarding results and RX sent in for BV.

## 2018-04-18 ENCOUNTER — Other Ambulatory Visit: Payer: Self-pay | Admitting: Family

## 2018-04-18 DIAGNOSIS — Z1231 Encounter for screening mammogram for malignant neoplasm of breast: Secondary | ICD-10-CM

## 2018-06-03 ENCOUNTER — Ambulatory Visit (INDEPENDENT_AMBULATORY_CARE_PROVIDER_SITE_OTHER): Payer: 59 | Admitting: *Deleted

## 2018-06-03 VITALS — BP 129/89 | HR 80 | Temp 98.3°F | Ht 66.0 in | Wt 239.2 lb

## 2018-06-03 DIAGNOSIS — N898 Other specified noninflammatory disorders of vagina: Secondary | ICD-10-CM

## 2018-06-03 DIAGNOSIS — Z113 Encounter for screening for infections with a predominantly sexual mode of transmission: Secondary | ICD-10-CM

## 2018-06-03 DIAGNOSIS — N760 Acute vaginitis: Secondary | ICD-10-CM

## 2018-06-03 DIAGNOSIS — B9689 Other specified bacterial agents as the cause of diseases classified elsewhere: Secondary | ICD-10-CM | POA: Diagnosis not present

## 2018-06-03 NOTE — Progress Notes (Signed)
   SUBJECTIVE:  28 y.o. female complains of no symptoms. Denies abnormal vaginal bleeding,discharge, odor or significant pelvic pain or fever. No UTI symptoms. Denies history of known exposure to STD.  Patient reported of having a new partner with vasectomy. She has been having sexual intercourse unprotected.   Patient's last menstrual period was 05/26/2018 (exact date).  OBJECTIVE:  She appears well, afebrile. Urine dipstick: not done.  ASSESSMENT:  No symptoms   PLAN:  GC, chlamydia, trichomonas, BVAG, CVAG probe sent to lab. Treatment: To be determined once lab results are received ROV prn if symptoms persist or worsen.  Clovis PuMartin, Clemmie Buelna L, RN

## 2018-06-04 LAB — CERVICOVAGINAL ANCILLARY ONLY
BACTERIAL VAGINITIS: POSITIVE — AB
Candida vaginitis: NEGATIVE
Chlamydia: NEGATIVE
NEISSERIA GONORRHEA: NEGATIVE
TRICH (WINDOWPATH): NEGATIVE

## 2018-06-04 LAB — HIV ANTIBODY (ROUTINE TESTING W REFLEX): HIV Screen 4th Generation wRfx: NONREACTIVE

## 2018-06-04 LAB — RPR: RPR: NONREACTIVE

## 2018-06-06 ENCOUNTER — Telehealth: Payer: Self-pay | Admitting: *Deleted

## 2018-06-06 DIAGNOSIS — B9689 Other specified bacterial agents as the cause of diseases classified elsewhere: Secondary | ICD-10-CM

## 2018-06-06 DIAGNOSIS — N760 Acute vaginitis: Principal | ICD-10-CM

## 2018-06-06 MED ORDER — METRONIDAZOLE 500 MG PO TABS: 500.0000 mg | ORAL_TABLET | Freq: Two times a day (BID) | ORAL | 0 refills | Status: DC

## 2018-06-06 NOTE — Telephone Encounter (Signed)
-----   Message from Raelyn Mora, [place] sent at 06/05/2018  8:17 PM EST ----- Treat for BV. Notify the patient of results, please.

## 2018-07-14 ENCOUNTER — Other Ambulatory Visit: Payer: Self-pay

## 2018-07-14 DIAGNOSIS — N760 Acute vaginitis: Principal | ICD-10-CM

## 2018-07-14 DIAGNOSIS — B9689 Other specified bacterial agents as the cause of diseases classified elsewhere: Secondary | ICD-10-CM

## 2018-07-14 MED ORDER — METRONIDAZOLE 500 MG PO TABS: 500.0000 mg | ORAL_TABLET | Freq: Two times a day (BID) | ORAL | 0 refills | Status: DC

## 2018-07-22 ENCOUNTER — Ambulatory Visit (INDEPENDENT_AMBULATORY_CARE_PROVIDER_SITE_OTHER): Payer: BLUE CROSS/BLUE SHIELD | Admitting: Family Medicine

## 2018-07-22 ENCOUNTER — Other Ambulatory Visit: Payer: Self-pay

## 2018-07-22 ENCOUNTER — Encounter: Payer: Self-pay | Admitting: Family Medicine

## 2018-07-22 VITALS — BP 110/78 | HR 82 | Temp 98.3°F | Ht 66.0 in | Wt 237.0 lb

## 2018-07-22 DIAGNOSIS — N926 Irregular menstruation, unspecified: Secondary | ICD-10-CM

## 2018-07-22 DIAGNOSIS — M153 Secondary multiple arthritis: Secondary | ICD-10-CM | POA: Diagnosis not present

## 2018-07-22 DIAGNOSIS — Z8669 Personal history of other diseases of the nervous system and sense organs: Secondary | ICD-10-CM

## 2018-07-22 DIAGNOSIS — F319 Bipolar disorder, unspecified: Secondary | ICD-10-CM

## 2018-07-22 DIAGNOSIS — R319 Hematuria, unspecified: Secondary | ICD-10-CM

## 2018-07-22 DIAGNOSIS — Z7689 Persons encountering health services in other specified circumstances: Secondary | ICD-10-CM

## 2018-07-22 DIAGNOSIS — R400 Somnolence: Secondary | ICD-10-CM

## 2018-07-22 LAB — POCT URINE PREGNANCY: Preg Test, Ur: NEGATIVE
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
## 2018-07-22 NOTE — Patient Instructions (Signed)
What You Need to Know About Osteoarthritis Osteoarthritis is a type of arthritis that affects tissue that covers the ends of bones in joints (cartilage). Cartilage acts as a cushion between the bones and helps them move smoothly. Osteoarthritis results when cartilage in the joints gets worn down. Osteoarthritis is sometimes called "wear and tear" arthritis. Osteoarthritis can affect any joint and can make movement painful. Hips, knees, fingers, and toes are some of the joints that are most often affected by osteoarthritis. You may be more likely to develop osteoarthritis if:  You are middle-aged or older.  You are obese.  You have injured a joint or had surgery on a joint.  You have a family history of osteoarthritis. How can osteoarthritis affect me? Osteoarthritis can cause:  Pain and swelling in your joint.  Difficulty moving your joint.  A grating or scraping feeling inside the joint when you move it.  Popping or creaking sounds when you move. This condition can make it harder to do things that you need or want to do each day. Osteoarthritis in a major joint, such as your knee or hip, can make it painful to walk or exercise. If you have osteoarthritis in your hands, you might not be able to grip items, twist your hand, or control small movements of your hands and fingers (fine motor skills). Over time, osteoarthritis could cause you to be less physically active. Being less active increases your risk for other long-term (chronic) health problems, such as diabetes and heart disease. What lifestyle changes can be made? You can lessen the impact that osteoarthritis has on your daily life by:  Switching to low-impact activities that do not put repeated pressure on your joints. For example, if you usually run or jog for exercise, try swimming or riding a bike instead.  Staying active. Build up to at least 150 minutes of physical activity each week to keep your joints healthy and keep your  body strong.  Losing weight. If you are overweight or obese, losing weight can take pressure off of your joints. If you need help with weight loss, talk with your health care provider or a diet and nutrition specialist (dietitian). What other changes can be made? You can also lessen the effect of osteoarthritis by:  Using assistive devices. Sometimes a brace, wrap, splint, specialized glove, or cane can help. Talk with your health care provider or physical therapist about when and how to use these.  Working with a physical therapist who can help you find ways to do your daily activities without harming your joints. A physical therapist can also teach you exercises and stretches to strengthen the muscles that support your joints.  Treating pain and inflammation. Take over-the-counter and prescription medicines for pain and inflammation only as told by your health care provider. If directed, you may put ice on the affected joint: ? If you have a removable assistive device, remove it as told by your health care provider. ? Put ice in a plastic bag. ? Place a towel between your skin and the bag. ? Leave the ice on for 20 minutes, 2-3 times a day. If other measures do not work, you may need joint surgery, such as joint replacement. What can happen if changes are not made? Osteoarthritis is a condition that gets worse over time (a progressive condition). If you do not take steps to strengthen your body and to slow down the progress of the disease, your condition may get worse more quickly. Your joints may  stiffen and become swollen, which will make them painful and hard to move. Where to find more information You can learn more about osteoarthritis from:  The Arthritis Foundation: www.http://www.ingram.com/  [employer] of Arthritis and Musculoskeletal and Skin Diseases: www.niams.https://www.ruiz-smith.com/ Contact a health care  provider if:  You cannot do your normal activities comfortably.  Your joint does not function at all.  Your pain is interfering with your sleep.  You are gaining weight.  Your joint appears to be changing in shape, instead of just being swollen and sore. Summary  Osteoarthritis is a painful joint disease that gets worse over time.  This condition can lead to other long-term (chronic) health problems.  There are changes that you can make to slow down the progression of the disease. This information is not intended to replace advice given to you by your health care provider. Make sure you discuss any questions you have with your health care provider. Document Released: 12/13/2015 Document Revised: 12/15/2015 Document Reviewed: 12/13/2015 Elsevier Interactive Patient Education  2019 [employer] Inc.  Living With Bipolar Disorder If you have been diagnosed with bipolar disorder, you may be relieved that you now know why you have felt or behaved a certain way. You may also feel overwhelmed about the treatment ahead, how to get the support you need, and how to deal with the condition day-to-day. With care and support, you can learn to manage your symptoms and live with bipolar disorder. How to manage lifestyle changes Managing stress Stress is your body's reaction to life changes and events, both good and bad. Stress can play a major role in bipolar disorder, so it is important to learn how to cope with stress. Some techniques to cope with stress include:  Meditation, muscle relaxation, and breathing exercises.  Exercise. Even a short daily walk can help to lower stress levels.  Getting enough good-quality sleep. Too little sleep can cause mania to start (can trigger mania).  Making a schedule to manage your time. Knowing your daily schedule can help to keep you from feeling overwhelmed by tasks and deadlines.  Spending time on hobbies that you enjoy.  Medicines Your health care  provider may suggest certain medicines if he or she feels that they will help improve your condition. Avoid using caffeine, alcohol, and other substances that may prevent your medicines from working properly (may interact). It is also important to:  Talk with your pharmacist or health care provider about all the medicines that you take, their possible side effects, and which medicines are safe to take together.  Make it your goal to take part in all treatment decisions (shared decision-making). Ask about possible side effects of medicines that your health care provider recommends, and tell him or her how you feel about having those side effects. It is best if shared decision-making with your health care provider is part of your total treatment plan. If you are taking medicines as part of your treatment, do not stop taking medicines before you ask your health care provider if it is safe to stop. You may need to have the medicine slowly decreased (tapered) over time to decrease the risk of harmful side effects. Relationships Spend time with people that you trust and with whom you feel a sense of understanding and calm. Try to find friends or family members who make you feel safe and can help you control feelings of mania. Consider going to couples counseling, family education classes, or family therapy to:  Educate your loved ones  about your condition and offer suggestions about how they can support you.  Help resolve conflicts.  Help develop communication skills in your relationships.  How to recognize changes in your condition Everyone responds differently to treatment for bipolar disorder. Some signs that your condition is improving include:  Leveling of your mood. You may have less anger and excitement about daily activities, and your low moods may not be as bad.  Your symptoms being less intense.  Feeling calm more often.  Thinking clearly.  Not experiencing consequences for extreme  behavior.  Feeling like your life is settling down.  Your behavior seeming more normal to you and to other people. Some signs that your condition may be getting worse include:  Sleep problems.  Moods cycling between deep lows and unusually high (excess) energy.  Extreme emotions.  More anger at loved ones.  Staying away from others (isolating yourself).  A feeling of power or superiority.  Completing a lot of tasks in a very short amount of time.  Unusual thoughts and behaviors.  Suicidal thoughts. Where to find support Talking to others  Try making a list of the people you may want to tell about your condition, such as the people you trust most.  Plan what you are willing to talk about and what you do not want to discuss. Think about your needs ahead of time, and how your friends and family members can support you.  Let your loved ones know when they can share advice and when you would just like them to listen.  Give your loved ones information about bipolar disorder, and encourage them to learn about the condition. Finances Not all insurance plans cover mental health care, so it is important to check with your insurance carrier. If paying for co-pays or counseling services is a problem, search for a local or county mental health care center. Public mental health care services may be offered there at a low cost or no cost when you are not able to see a private health care provider. If you are taking medicine for depression, you may be able to get the generic form, which may be less expensive than brand-name medicine. Some makers of prescription medicines also offer help to patients who cannot afford the medicines they need. Follow these instructions at home: Medicines  Take over-the-counter and prescription medicines only as told by your health care provider or pharmacist.  Ask your pharmacist what over-the-counter cold medicines you should avoid. Some medicines can make  symptoms worse. General instructions  Ask for support from trusted family members or friends to make sure you stay on track with your treatment.  Keep a journal to write down your daily moods, medicines, sleep habits, and life events. This may help you have more success with your treatment.  Make and follow a routine for daily meal times. Eat healthy foods, such as whole grains, vegetables, and fresh fruit.  Try to go to sleep and wake up around the same time every day.  Keep all follow-up visits as told by your health care provider. This is important. Questions to ask your health care provider:  If you are taking medicines: ? How long do I need to take medicine? ? Are there any long-term side effects of my medicine? ? Are there any alternatives to taking medicine?  How would I benefit from therapy?  How often should I follow up with a health care provider? Contact a health care provider if:  Your symptoms get worse  or they do not get better with treatment. Get help right away if:  You have thoughts about harming yourself or others. If you ever feel like you may hurt yourself or others, or have thoughts about taking your own life, get help right away. You can go to your nearest emergency department or call:  Your local emergency services (911 in the [place]  A suicide crisis helpline, such as the National Suicide Prevention Lifeline at 641-131-3910. This is open 24-hours a day. Summary  Learning ways to deal with stress can help to calm you and may also help your treatment work better.  There is a wide range of medicines that can help to treat bipolar disorder.  Having healthy relationships can help to make your moods more stable.  Contact a health care provider if your symptoms get worse or they do not get better with treatment. This information is not intended to replace advice given to you by your health care provider. Make sure you discuss any questions you have with  your health care provider. Document Released: 08/23/2016 Document Revised: 08/23/2016 Document Reviewed: 08/23/2016 Elsevier Interactive Patient Education  2019 [employer].

## 2018-07-23 LAB — URINE CULTURE
MICRO NUMBER:: 328459
SPECIMEN QUALITY:: ADEQUATE

## 2018-07-24 LAB — POC URINALSYSI DIPSTICK (AUTOMATED)
Bilirubin, UA: NEGATIVE
Glucose, UA: NEGATIVE
Ketones, UA: NEGATIVE
Nitrite, UA: NEGATIVE
Protein, UA: NEGATIVE
Spec Grav, UA: 1.01 (ref 1.010–1.025)
Urobilinogen, UA: 0.2 E.U./dL
pH, UA: 7.5 (ref 5.0–8.0)

## 2018-07-24 NOTE — Addendum Note (Signed)
Addended by: Carola Rhine on: 07/24/2018 05:23 PM   Modules accepted: Orders

## 2018-08-18 ENCOUNTER — Ambulatory Visit: Payer: Self-pay | Admitting: Psychology

## 2018-09-23 ENCOUNTER — Encounter: Payer: Self-pay | Admitting: Family Medicine

## 2018-09-23 ENCOUNTER — Other Ambulatory Visit: Payer: Self-pay

## 2018-09-23 ENCOUNTER — Ambulatory Visit: Payer: BLUE CROSS/BLUE SHIELD | Admitting: Family Medicine

## 2018-09-23 DIAGNOSIS — R0683 Snoring: Secondary | ICD-10-CM

## 2018-09-23 DIAGNOSIS — R400 Somnolence: Secondary | ICD-10-CM

## 2018-09-23 NOTE — Progress Notes (Signed)
Virtual Visit via Telephone Note  I connected with Nichole Page on 09/23/18 at  1:00 PM EDT by telephone and verified that I am speaking with the correct person using two identifiers.   I discussed the limitations, risks, security and privacy concerns of performing an evaluation and management service by telephone and the availability of in person appointments. I also discussed with the patient that there may be a patient responsible charge related to this service. The patient expressed understanding and agreed to proceed.  Location patient: home Location provider: work or home office Participants present for the call: patient, provider Patient did not have a visit in the prior 7 days to address this/these issue(s).   History of Present Illness: Pt was calling to check on having a referral placed for a sleep study.  Pt notes daytime somnolence and sleep at night becoming worse.  Pt now waking up in the middle of the night gasping.   Observations/Objective: Patient sounds cheerful and well on the phone. I do not appreciate any SOB. Speech and thought processing are grossly intact. Patient reported vitals:  Assessment and Plan: Snoring  Daytime somnolence -pt advised referral to pulmonology placed 07/24/18.  Per notes Pulm was to contact pt directly to schedule appt. -pt advised to contact office to make appt.  Pt to notify this provider if needed for further assistance.   Follow Up Instructions: F/u prn  I did not refer this patient for an OV in the next 24 hours for this/these issue(s).  I discussed the assessment and treatment plan with the patient. The patient was provided an opportunity to ask questions and all were answered. The patient agreed with the plan and demonstrated an understanding of the instructions.   The patient was advised to call back or seek an in-person evaluation if the symptoms worsen or if the condition fails to improve as anticipated.  I  provided 3 minutes of non-face-to-face time during this encounter.   Deeann Saint, MD

## 2018-10-16 ENCOUNTER — Telehealth (INDEPENDENT_AMBULATORY_CARE_PROVIDER_SITE_OTHER): Payer: BLUE CROSS/BLUE SHIELD | Admitting: Obstetrics and Gynecology

## 2018-10-16 ENCOUNTER — Other Ambulatory Visit: Payer: Self-pay | Admitting: *Deleted

## 2018-10-16 ENCOUNTER — Encounter: Payer: Self-pay | Admitting: Obstetrics and Gynecology

## 2018-10-16 DIAGNOSIS — Z20822 Contact with and (suspected) exposure to covid-19: Secondary | ICD-10-CM

## 2018-10-16 DIAGNOSIS — Z30015 Encounter for initial prescription of vaginal ring hormonal contraceptive: Secondary | ICD-10-CM | POA: Diagnosis not present

## 2018-10-16 NOTE — Patient Instructions (Signed)
Ethinyl Estradiol; Etonogestrel vaginal ring  What is this medicine?  ETHINYL ESTRADIOL; ETONOGESTREL (ETH in il es tra DYE ole; et oh noe JES trel) vaginal ring is a flexible, vaginal ring used as a contraceptive (birth control method). This medicine combines 2 types of female hormones, an estrogen and a progestin. This ring is used to prevent ovulation and pregnancy. Each ring is effective for 1 month.  This medicine may be used for other purposes; ask your health care provider or pharmacist if you have questions.  COMMON BRAND NAME(S): NuvaRing  What should I tell my health care provider before I take this medicine?  They need to know if you have any of these conditions:  -abnormal vaginal bleeding  -blood vessel disease or blood clots  -breast, cervical, endometrial, ovarian, liver, or uterine cancer  -diabetes  -gallbladder disease  -having surgery  -heart disease or recent heart attack  -high blood pressure  -high cholesterol or triglycerides  -history of irregular heartbeat or heart valve problems  -kidney disease  -liver disease  -migraine headaches  -protein C deficiency  -protein S deficiency  -recently had a baby, miscarriage, or abortion  -stroke  -systemic lupus erythematosus (SLE)  -tobacco smoker  -your age is more than 29 years old  -an unusual or allergic reaction to estrogens, progestins, other medicines, foods, dyes, or preservatives  -pregnant or trying to get pregnant  -breast-feeding  How should I use this medicine?  Insert the ring into your vagina as directed. Follow the directions on the prescription label. The ring will remain place for 3 weeks and is then removed for a 1-week break. A new ring is inserted 1 week after the last ring was removed, on the same day of the week. Check often to make sure the ring is still in place. If the ring was out of the vagina for an unknown amount of time, you may not be protected from pregnancy. Perform a pregnancy test and call your doctor. Do not use  more often than directed.  A patient package insert for the product will be given with each prescription and refill. Read this sheet carefully each time. The sheet may change frequently.  Contact your pediatrician regarding the use of this medicine in children. Special care may be needed.  Overdosage: If you think you have taken too much of this medicine contact a poison control center or emergency room at once.  NOTE: This medicine is only for you. Do not share this medicine with others.  What if I miss a dose?  You will need to use the ring exactly as directed. It is very important to follow the schedule every cycle. If you do not use the ring as directed, you may not be protected from pregnancy. If the ring should slip out, is lost, or if you leave it in longer or shorter than you should, contact your health care professional for advice.  What may interact with this medicine?  Do not take this medicine with the following medications:  -dasabuvir; ombitasvir; paritaprevir; ritonavir  -ombitasvir; paritaprevir; ritonavir  -vaginal lubricants or other vaginal products that are oil-based or silicone-based  This medicine may also interact with the following medications:  -acetaminophen  -antibiotics or medicines for infections, especially rifampin, rifabutin, rifapentine, and griseofulvin, and possibly penicillins or tetracyclines  -aprepitant or fosaprepitant  -armodafinil  -ascorbic acid (vitamin C)  -barbiturate medicines, such as phenobarbital or primidone  -bosentan  -certain antiviral medicines for hepatitis, HIV or AIDS  -certain   medicines for cancer treatment  -certain medicines for seizures like carbamazepine, clobazam, felbamate, lamotrigine, oxcarbazepine, phenytoin, rufinamide, topiramate  -certain medicines for treating high cholesterol  -cyclosporine  -dantrolene  -elagolix  -flibanserin  -grapefruit juice  -lesinurad  -medicines for diabetes  -medicines to treat fungal infections, such as griseofulvin,  miconazole, fluconazole, ketoconazole, itraconazole, posaconazole or voriconazole  -mifepristone  -mitotane  -modafinil  -morphine  -mycophenolate  -St. John's wort  -tamoxifen  -temazepam  -theophylline or aminophylline  -thyroid hormones  -tizanidine  -tranexamic acid  -ulipristal  -warfarin  This list may not describe all possible interactions. Give your health care provider a list of all the medicines, herbs, non-prescription drugs, or dietary supplements you use. Also tell them if you smoke, drink alcohol, or use illegal drugs. Some items may interact with your medicine.  What should I watch for while using this medicine?  Visit your doctor or health care professional for regular checks on your progress. You will need a regular breast and pelvic exam and Pap smear while on this medicine.  Check with your doctor or health care professional to see if you need an additional method of contraception during the first cycle that you use this ring. Female condoms (made with natural rubber latex, polyisoprene, and polyurethane) and spermicides may be used. Do not use a diaphragm, cervical cap, or a female condom, as the ring can interfere with these birth control methods and their proper placement.  If you have any reason to think you are pregnant, stop using this medicine right away and contact your doctor or health care professional.  If you are using this medicine for hormone related problems, it may take several cycles of use to see improvement in your condition.  Smoking increases the risk of getting a blood clot or having a stroke while you are using hormonal birth control, especially if you are more than 29 years old. You are strongly advised not to smoke.  Some women are prone to getting dark patches on the skin of the face (cholasma). Your risk of getting chloasma with this medicine is higher if you had chloasma during a pregnancy. Keep out of the sun. If you cannot avoid being in the sun, wear protective  clothing and use sunscreen. Do not use sun lamps or tanning beds/booths.  This medicine can make your body retain fluid, making your fingers, hands, or ankles swell. Your blood pressure can go up. Contact your doctor or health care professional if you feel you are retaining fluid.  If you are going to have elective surgery, you may need to stop using this medicine before the surgery. Consult your health care professional for advice.  This medicine does not protect you against HIV infection (AIDS) or any other sexually transmitted diseases.  What side effects may I notice from receiving this medicine?  Side effects that you should report to your doctor or health care professional as soon as possible:  -allergic reactions such as skin rash or itching, hives, swelling of the lips, mouth, tongue, or throat  -depression  -high blood pressure  -migraines or severe, sudden headaches  -signs and symptoms of a blood clot such as breathing problems; changes in vision; chest pain; severe, sudden headache; pain, swelling, warmth in the leg; trouble speaking; sudden numbness or weakness of the face, arm or leg  -signs and symptoms of infection like fever or chills with dizziness and a sunburn-like rash, or pain or trouble passing urine  -stomach pain  -symptoms of   vaginal infection like itching, irritation or unusual discharge  -yellowing of the eyes or skin  Side effects that usually do not require medical attention (report these to your doctor or health care professional if they continue or are bothersome):  -acne  -breast pain, tenderness  -irregular vaginal bleeding or spotting, particularly during the first month of use  -mild headache  -nausea  -painful periods  -vomiting  This list may not describe all possible side effects. Call your doctor for medical advice about side effects. You may report side effects to FDA at 1-800-FDA-1088.  Where should I keep my medicine?  Keep out of the reach of children.  Store unopened  rings in the original foil pouch at room temperature between 20 and 25 degrees C (68 and 77 degrees F) for up to 4 months. Protect from light. Do not store above 30 degrees C (86 degrees F). Throw away any unused medicine after the expiration date. A ring may only be used for 1 cycle (1 month). After the 3-week cycle, a used ring is removed and should be placed in the re-closable foil pouch and discarded in the trash out of reach of children and pets. Do NOT flush down the toilet.  NOTE: This sheet is a summary. It may not cover all possible information. If you have questions about this medicine, talk to your doctor, pharmacist, or health care provider.  © 2019 Elsevier/[employer] Standard (2016-12-21 14:41:10)

## 2018-10-16 NOTE — Progress Notes (Signed)
MY CHART VIDEO VIRTUAL GYNECOLOGY VISIT ENCOUNTER NOTE  I connected with Nichole Page on 10/16/18 at  8:10 AM EDT by My Chart video in her car in the [employer] parking lot and verified that I am speaking with the correct person using two identifiers.   I discussed the limitations, risks, security and privacy concerns of performing an evaluation and management service by My Chart video and the availability of in person appointments. I also discussed with the patient that there may be a patient responsible charge related to this service. The patient expressed understanding and agreed to proceed.   History:  Nichole Page is a 28 y.o. [id number] female being evaluated today for restart on birth control. The birth control she chooses to start with is Nuvaring. She has not been on any form of BC "for years." She states "she is not very sexually active, but when I do I use condoms." The last form of birth control she had was a Mirena IUD and she has used Nuvaring in the past; "just after having my daughter." She denies any abnormal vaginal discharge, bleeding, pelvic pain or other concerns.       Past Medical History:  Diagnosis Date  . Arthritis   . Bipolar 2 disorder (HCC)   . Depression   . Sleep apnea   . STI (sexually transmitted infection)    Past Surgical History:  Procedure Laterality Date  . I&D EXTREMITY  04/02/2012   Procedure: IRRIGATION AND DEBRIDEMENT EXTREMITY;  Surgeon: Javier DockerJeffrey C Beane, MD;  Location: [facility] OR;  Service: Orthopedics;  Laterality: Left;  . I&D EXTREMITY  04/04/2012   Procedure: IRRIGATION AND DEBRIDEMENT EXTREMITY;  Surgeon: Javier DockerJeffrey C Beane, MD;  Location: [facility] OR;  Service: Orthopedics;  Laterality: Left;  . NO PAST SURGERIES    . ORIF ANKLE FRACTURE  04/02/2012   Procedure: OPEN REDUCTION INTERNAL FIXATION (ORIF) ANKLE FRACTURE;  Surgeon: Javier DockerJeffrey C Beane, MD;  Location: [facility] OR;  Service: Orthopedics;  Laterality: Left;   The following portions of the  patient's history were reviewed and updated as appropriate: allergies, current medications, past family history, past medical history, past social history, past surgical history and problem list.   Health Maintenance:  Normal pap and negative HRHPV on 01/24/2018.    Review of Systems:  Pertinent items noted in HPI and remainder of comprehensive ROS otherwise negative.  **VS not done by patient prior to visit. She will take once she is home and send via My Chart. Physical Exam:   General:  Alert, oriented and cooperative.   Mental Status: Normal mood and affect perceived. Normal judgment and thought content.  Physical exam deferred due to nature of the encounter  Assessment and Plan:     Encounter for initial prescription of vaginal ring hormonal contraceptive - Advised to send BP via My Chart in order to make sure her BP is WNL before sending Rx for Nuvaring - Patient verbalized an understanding of the plan of care and agrees.     I discussed the assessment and treatment plan with the patient. The patient was provided an opportunity to ask questions and all were answered. The patient agreed with the plan and demonstrated an understanding of the instructions.   The patient was advised to call back or seek an in-person evaluation/go to the ED if the symptoms worsen or if the condition fails to improve as anticipated.  I provided 5 minutes of non-face-to-face time during this encounter. There was 5 minutes of  chart review time spent prior to this encounter. Total time spent = 10 minutes.    Laury Deep, [place] [facility] [place]

## 2018-10-17 ENCOUNTER — Ambulatory Visit: Payer: BLUE CROSS/BLUE SHIELD | Admitting: Family

## 2018-10-19 LAB — NOVEL CORONAVIRUS, NAA: SARS-CoV-2, NAA: NOT DETECTED

## 2018-10-25 ENCOUNTER — Telehealth: Payer: Self-pay | Admitting: Obstetrics and Gynecology

## 2018-10-25 NOTE — Telephone Encounter (Signed)
Message left for pt to call me back at [phone]. Notified that I would like to know what BP is, so I can send in Rx for Nuvaring.  Laury Deep, CNM

## 2018-11-20 ENCOUNTER — Other Ambulatory Visit: Payer: Self-pay | Admitting: *Deleted

## 2018-11-20 DIAGNOSIS — Z20822 Contact with and (suspected) exposure to covid-19: Secondary | ICD-10-CM

## 2018-11-24 NOTE — Addendum Note (Signed)
Addended by: Omarian Jaquith M on: 11/24/2018 05:31 PM   Modules accepted: Orders  

## 2019-02-02 ENCOUNTER — Encounter: Payer: Self-pay | Admitting: Family Medicine

## 2019-02-13 ENCOUNTER — Ambulatory Visit: Payer: BLUE CROSS/BLUE SHIELD | Admitting: Family

## 2019-04-17 ENCOUNTER — Telehealth: Payer: Self-pay | Admitting: *Deleted

## 2019-04-17 NOTE — Telephone Encounter (Signed)
Copied from [place] [id number]. Topic: Appointment Scheduling - Scheduling Inquiry for Clinic >> Apr 17, 2019  3:43 PM Oneta Rack wrote: Reason for CRM:  Patient would like to know if PCP can work her in prior to January for her physical stating she will be out of town in January

## 2019-04-20 NOTE — Telephone Encounter (Signed)
Called left a voicemail for pt to call the office regarding her request for CPE before January

## 2019-04-22 NOTE — Telephone Encounter (Signed)
Pt appointment was rescheduled for 04/29/2019

## 2019-04-27 ENCOUNTER — Other Ambulatory Visit (HOSPITAL_BASED_OUTPATIENT_CLINIC_OR_DEPARTMENT_OTHER): Payer: Self-pay | Admitting: Family

## 2019-04-27 DIAGNOSIS — Z1231 Encounter for screening mammogram for malignant neoplasm of breast: Secondary | ICD-10-CM

## 2019-04-28 ENCOUNTER — Other Ambulatory Visit: Payer: Self-pay

## 2019-04-28 ENCOUNTER — Encounter (HOSPITAL_BASED_OUTPATIENT_CLINIC_OR_DEPARTMENT_OTHER): Payer: Self-pay

## 2019-04-28 ENCOUNTER — Ambulatory Visit (HOSPITAL_BASED_OUTPATIENT_CLINIC_OR_DEPARTMENT_OTHER)
Admission: RE | Admit: 2019-04-28 | Discharge: 2019-04-28 | Disposition: A | Discharge status: Home / Self Care | Payer: BLUE CROSS/BLUE SHIELD | Source: Ambulatory Visit

## 2019-04-28 DIAGNOSIS — Z1231 Encounter for screening mammogram for malignant neoplasm of breast: Secondary | ICD-10-CM

## 2019-04-29 ENCOUNTER — Encounter: Payer: Self-pay | Admitting: Family Medicine

## 2019-04-29 ENCOUNTER — Ambulatory Visit (INDEPENDENT_AMBULATORY_CARE_PROVIDER_SITE_OTHER): Payer: BLUE CROSS/BLUE SHIELD | Admitting: Nurse Practitioner

## 2019-04-29 ENCOUNTER — Ambulatory Visit (INDEPENDENT_AMBULATORY_CARE_PROVIDER_SITE_OTHER): Payer: BLUE CROSS/BLUE SHIELD | Admitting: Family Medicine

## 2019-04-29 ENCOUNTER — Encounter: Payer: Self-pay | Admitting: Nurse Practitioner

## 2019-04-29 ENCOUNTER — Other Ambulatory Visit: Payer: Self-pay

## 2019-04-29 VITALS — BP 110/80 | HR 92 | Temp 97.8°F | Wt 226.0 lb

## 2019-04-29 VITALS — BP 126/86 | HR 99 | Temp 98.1°F | Ht 67.0 in | Wt 228.0 lb

## 2019-04-29 DIAGNOSIS — Z0000 Encounter for general adult medical examination without abnormal findings: Secondary | ICD-10-CM

## 2019-04-29 DIAGNOSIS — Z6835 Body mass index (BMI) 35.0-35.9, adult: Secondary | ICD-10-CM | POA: Insufficient documentation

## 2019-04-29 DIAGNOSIS — Z01419 Encounter for gynecological examination (general) (routine) without abnormal findings: Secondary | ICD-10-CM

## 2019-04-29 DIAGNOSIS — E6609 Other obesity due to excess calories: Secondary | ICD-10-CM

## 2019-04-29 DIAGNOSIS — Z13220 Encounter for screening for lipoid disorders: Secondary | ICD-10-CM

## 2019-04-29 DIAGNOSIS — Z6836 Body mass index (BMI) 36.0-36.9, adult: Secondary | ICD-10-CM | POA: Diagnosis not present

## 2019-04-29 DIAGNOSIS — Z113 Encounter for screening for infections with a predominantly sexual mode of transmission: Secondary | ICD-10-CM | POA: Diagnosis not present

## 2019-04-29 LAB — CBC
HCT: 41.4 % (ref 36.0–46.0)
Hemoglobin: 13.8 g/dL (ref 12.0–15.0)
MCHC: 33.4 g/dL (ref 30.0–36.0)
MCV: 95.6 fl (ref 78.0–100.0)
Platelets: 482 10*3/uL — ABNORMAL HIGH (ref 150.0–400.0)
RBC: 4.33 Mil/uL (ref 3.87–5.11)
RDW: 12.3 % (ref 11.5–15.5)
WBC: 5.9 10*3/uL (ref 4.0–10.5)

## 2019-04-29 LAB — BASIC METABOLIC PANEL
BUN: 9 mg/dL (ref 6–23)
CO2: 27 mEq/L (ref 19–32)
Calcium: 9.9 mg/dL (ref 8.4–10.5)
Chloride: 102 mEq/L (ref 96–112)
Creatinine, Ser: 0.68 mg/dL (ref 0.40–1.20)
GFR: 123.25 mL/min (ref >60.00)
Glucose, Bld: 93 mg/dL (ref 70–99)
Potassium: 3.9 mEq/L (ref 3.5–5.1)
Sodium: 139 mEq/L (ref 135–145)

## 2019-04-29 LAB — T4, FREE: Free T4: 0.95 ng/dL (ref 0.60–1.60)

## 2019-04-29 LAB — LIPID PANEL
Cholesterol: 155 mg/dL (ref 0–200)
HDL: 61 mg/dL (ref >39.00)
LDL Cholesterol: 78 mg/dL (ref 0–99)
NonHDL: 94.29
Total CHOL/HDL Ratio: 3
Triglycerides: 79 mg/dL (ref 0.0–149.0)
VLDL: 15.8 mg/dL (ref 0.0–40.0)

## 2019-04-29 LAB — TSH: TSH: 1.03 u[IU]/mL (ref 0.35–4.50)

## 2019-04-29 LAB — HEMOGLOBIN A1C: Hgb A1c MFr Bld: 5.4 % (ref 4.6–6.5)

## 2019-04-29 NOTE — Progress Notes (Signed)
Subjective:     Nichole Page is a 28 y.o. female and is here for a comprehensive physical exam. The patient reports no problems.  Pt and her daughter recently moved to [place], [place].  Pt's bf lives there.  She has been able to work from home.  Pt states she had a stressful year in [place], but is hoping to keep her providers here.  Pt is thinking about becoming pregnant after the COVID-19 pandemic improves.    Social History   Socioeconomic History  . Marital status: Single    Spouse name: Not on file  . Number of children: Not on file  . Years of education: Not on file  . Highest education level: Not on file  Occupational History  . Not on file  Tobacco Use  . Smoking status: Former Games developer  . Smokeless tobacco: Former Engineer, water and Sexual Activity  . Alcohol use: Yes    Alcohol/week: 3.0 standard drinks    Types: 3 Glasses of wine per week    Comment: pt states she drinks a few times a week  . Drug use: No    Frequency: 1.0 times per week  . Sexual activity: Yes    Partners: Male, Female    Birth control/protection: None  Other Topics Concern  . Not on file  Social History Narrative   Director of Maternal Health at the [employer]   Social Determinants of Health   Financial Resource Strain:   . Difficulty of Paying Living Expenses: Not on file  Food Insecurity:   . Worried About Programme researcher, broadcasting/film/video in the Last Year: Not on file  . Ran Out of Food in the Last Year: Not on file  Transportation Needs:   . Lack of Transportation (Medical): Not on file  . Lack of Transportation (Non-Medical): Not on file  Physical Activity:   . Days of Exercise per Week: Not on file  . Minutes of Exercise per Session: Not on file  Stress:   . Feeling of Stress : Not on file  Social Connections:   . Frequency of Communication with Friends and Family: Not on file  . Frequency of Social Gatherings with Friends and Family: Not on file  . Attends Religious  Services: Not on file  . Active Member of Clubs or Organizations: Not on file  . Attends Banker Meetings: Not on file  . Marital Status: Not on file  Intimate Partner Violence:   . Fear of Current or Ex-Partner: Not on file  . Emotionally Abused: Not on file  . Physically Abused: Not on file  . Sexually Abused: Not on file   Health Maintenance  Topic Date Due  . INFLUENZA VACCINE  12/06/2018  . PAP-Cervical Cytology Screening  01/24/2021  . PAP SMEAR-Modifier  01/24/2021  . TETANUS/TDAP  04/02/2022  . HIV Screening  Completed    The following portions of the patient's history were reviewed and updated as appropriate: allergies, current medications, past family history, past medical history, past social history, past surgical history and problem list.  Review of Systems Pertinent items noted in HPI and remainder of comprehensive ROS otherwise negative.   Objective:    BP 110/80 (BP Location: Left Arm, Patient Position: Sitting, Cuff Size: Large)   Pulse 92   Temp 97.8 F (36.6 C) (Temporal)   Wt 226 lb (102.5 kg)   SpO2 98%   BMI 36.48 kg/m  General appearance: alert, cooperative and no distress  Head: Normocephalic, without obvious abnormality, atraumatic Eyes: conjunctivae/corneas clear. PERRL, EOM's intact. Fundi benign. Ears: normal TM's and external ear canals both ears Nose: Nares normal. Septum midline. Mucosa normal. No drainage or sinus tenderness. Throat: lips, mucosa, and tongue normal; teeth and gums normal Neck: no adenopathy, no carotid bruit, no JVD, supple, symmetrical, trachea midline and thyroid not enlarged, symmetric, no tenderness/mass/nodules Lungs: clear to auscultation bilaterally Heart: regular rate and rhythm, S1, S2 normal, no murmur, click, rub or gallop Abdomen: soft, non-tender; bowel sounds normal; no masses,  no organomegaly Extremities: extremities normal, atraumatic, no cyanosis or edema Pulses: 2+ and symmetric Skin: Skin  color, texture, turgor normal. No rashes or lesions Lymph nodes: Cervical, supraclavicular, and axillary nodes normal. Neurologic: Alert and oriented X 3, normal strength and tone. Normal symmetric reflexes. Normal coordination and gait    Assessment:    Healthy female exam.      Plan:     Anticipatory guidance given including wearing seatbelts, smoke detectors in the home, increasing physical activity, increasing p.o. intake of water and vegetables. -will obtain labs -pt to have pap this afternoon at OB/Gyn appt -offered influenza vaccine.  Pt declines. -discussed starting prenatal vitamins.   -given handouts -next CPE in 1 yr See After Visit Summary for Counseling Recommendations    Class 2 obesity due to excess calories without serious comorbidity with body mass index (BMI) of 36.0 to 36.9 in adult  -discussed lifestyle modifications and increasing physical activity - Plan: Hemoglobin A1c, TSH, T4, Free  Screening for cholesterol level  - Plan: Lipid Panel  F/u prn  Grier Mitts, MD

## 2019-04-29 NOTE — Patient Instructions (Addendum)
Preventive Care 21-29 Years Old, Female Preventive care refers to visits with your health care provider and lifestyle choices that can promote health and wellness. This includes:  A yearly physical exam. This may also be called an annual well check.  Regular dental visits and eye exams.  Immunizations.  Screening for certain conditions.  Healthy lifestyle choices, such as eating a healthy diet, getting regular exercise, not using drugs or products that contain nicotine and tobacco, and limiting alcohol use. What can I expect for my preventive care visit? Physical exam Your health care provider will check your:  Height and weight. This may be used to calculate body mass index (BMI), which tells if you are at a healthy weight.  Heart rate and blood pressure.  Skin for abnormal spots. Counseling Your health care provider may ask you questions about your:  Alcohol, tobacco, and drug use.  Emotional well-being.  Home and relationship well-being.  Sexual activity.  Eating habits.  Work and work environment.  Method of birth control.  Menstrual cycle.  Pregnancy history. What immunizations do I need?  Influenza (flu) vaccine  This is recommended every year. Tetanus, diphtheria, and pertussis (Tdap) vaccine  You may need a Td booster every 10 years. Varicella (chickenpox) vaccine  You may need this if you have not been vaccinated. Human papillomavirus (HPV) vaccine  If recommended by your health care provider, you may need three doses over 6 months. Measles, mumps, and rubella (MMR) vaccine  You may need at least one dose of MMR. You may also need a second dose. Meningococcal conjugate (MenACWY) vaccine  One dose is recommended if you are age 19-21 years and a first-year college student living in a residence hall, or if you have one of several medical conditions. You may also need additional booster doses. Pneumococcal conjugate (PCV13) vaccine  You may need  this if you have certain conditions and were not previously vaccinated. Pneumococcal polysaccharide (PPSV23) vaccine  You may need one or two doses if you smoke cigarettes or if you have certain conditions. Hepatitis A vaccine  You may need this if you have certain conditions or if you travel or work in places where you may be exposed to hepatitis A. Hepatitis B vaccine  You may need this if you have certain conditions or if you travel or work in places where you may be exposed to hepatitis B. Haemophilus influenzae type b (Hib) vaccine  You may need this if you have certain conditions. You may receive vaccines as individual doses or as more than one vaccine together in one shot (combination vaccines). Talk with your health care provider about the risks and benefits of combination vaccines. What tests do I need?  Blood tests  Lipid and cholesterol levels. These may be checked every 5 years starting at age 20.  Hepatitis C test.  Hepatitis B test. Screening  Diabetes screening. This is done by checking your blood sugar (glucose) after you have not eaten for a while (fasting).  Sexually transmitted disease (STD) testing.  BRCA-related cancer screening. This may be done if you have a family history of breast, ovarian, tubal, or peritoneal cancers.  Pelvic exam and Pap test. This may be done every 3 years starting at age 21. Starting at age 30, this may be done every 5 years if you have a Pap test in combination with an HPV test. Talk with your health care provider about your test results, treatment options, and if necessary, the need for more tests.   Follow these instructions at home: Eating and drinking   Eat a diet that includes fresh fruits and vegetables, whole grains, lean protein, and low-fat dairy.  Take vitamin and mineral supplements as recommended by your health care provider.  Do not drink alcohol if: ? Your health care provider tells you not to drink. ? You are  pregnant, may be pregnant, or are planning to become pregnant.  If you drink alcohol: ? Limit how much you have to 0-1 drink a day. ? Be aware of how much alcohol is in your drink. In the [place] one drink equals one 12 oz bottle of beer (355 mL), one 5 oz glass of wine (148 mL), or one 1 oz glass of hard liquor (44 mL). Lifestyle  Take daily care of your teeth and gums.  Stay active. Exercise for at least 30 minutes on 5 or more days each week.  Do not use any products that contain nicotine or tobacco, such as cigarettes, e-cigarettes, and chewing tobacco. If you need help quitting, ask your health care provider.  If you are sexually active, practice safe sex. Use a condom or other form of birth control (contraception) in order to prevent pregnancy and STIs (sexually transmitted infections). If you plan to become pregnant, see your health care provider for a preconception visit. What's next?  Visit your health care provider once a year for a well check visit.  Ask your health care provider how often you should have your eyes and teeth checked.  Stay up to date on all vaccines. This information is not intended to replace advice given to you by your health care provider. Make sure you discuss any questions you have with your health care provider. Document Released: 06/19/2001 Document Revised: 01/02/2018 Document Reviewed: 01/02/2018 Elsevier Patient Education  2020 [place] [employer] Gain During Pregnancy, Adult A certain amount of weight gain during pregnancy is normal and healthy. How much weight you should gain depends on your overall health and a measurement called BMI (body mass index). BMI is an estimate of your body fat based on your height and weight. You can use an Freight forwarder to figure out your BMI, or you can ask your health care provider to calculate it for you at your next visit. Your recommended pregnancy weight gain is based on your pre-pregnancy BMI.  General guidelines for a healthy total weight gain during pregnancy are listed below. If your BMI at or before the start of your pregnancy is:  Less than 18.5 (underweight), you should gain 28-40 lb (13-18 kg).  18.5-24.9 (normal weight), you should gain 25-35 lb (11-16 kg).  25-29.9 (overweight), you should gain 15-25 lb (7-11 kg).  30 or higher (obese), you should gain 11-20 lb (5-9 kg). These ranges vary depending on your individual health. If you are carrying more than one baby (multiples), it may be safe to gain more weight than these recommendations. If you gain less weight than recommended, that may be safe as long as your baby is growing and developing normally. How can unhealthy weight gain affect me and my baby? Gaining too much weight during pregnancy can lead to pregnancy complications, such as:  A temporary form of diabetes that develops during pregnancy (gestational diabetes).  High blood pressure during pregnancy and protein in your urine (preeclampsia).  High blood pressure during pregnancy without protein in your urine (gestational hypertension).  Your baby having a high weight at birth, which may: ? Raise your risk of having a  more difficult delivery or a surgical delivery (cesarean delivery, or C-section). ? Raise your child's risk of developing obesity during childhood. Not gaining enough weight can be life-threatening for your baby, and it may raise your baby's chances of:  Being born early (preterm).  Growing more slowly than normal during pregnancy (growth restriction).  Having a low weight at birth. What actions can I take to gain a healthy amount of weight during pregnancy? General instructions  Keep track of your weight gain during pregnancy.  Take over-the-counter and prescription medicines only as told by your health care provider. Take all prenatal supplements as directed.  Keep all health care visits during pregnancy (prenatal visits). These visits  are a good time to discuss your weight gain. Your health care provider will weigh you at each visit to make sure you are gaining a healthy amount of weight. Nutrition   Eat a balanced, nutrient-rich diet. Eat plenty of: ? Fruits and vegetables, such as berries and broccoli. ? Whole grains, such as millet, barley, whole-wheat breads and cereals, and oatmeal. ? Low-fat dairy products or non-dairy products such as almond milk or rice milk. ? Protein foods, such as lean meat, chicken, eggs, and legumes (such as peas, beans, soybeans, and lentils).  Avoid foods that are fried or have a lot of fat, salt (sodium), or sugar.  Drink enough fluid to keep your urine pale yellow.  Choose healthy snack and drink options when you are at work or on the go: ? Drink water. Avoid soda, sports drinks, and juices that have added sugar. ? Avoid drinks with caffeine, such as coffee and energy drinks. ? Eat snacks that are high in protein, such as nuts, protein bars, and low-fat yogurt. ? Carry convenient snacks in your purse that do not need refrigeration, such as a pack of trail mix, an apple, or a granola bar.  If you need help improving your diet, work with a health care provider or a diet and nutrition specialist (dietitian). Activity   Exercise regularly, as told by your health care provider. ? If you were active before becoming pregnant, you may be able to continue your regular fitness activities. ? If you were not active before pregnancy, you may gradually build up to exercising for 30 or more minutes on most days of the week. This may include walking, swimming, or yoga.  Ask your health care provider what activities are safe for you. Talk with your health care provider about whether you may need to be excused from certain school or work activities. Where to find more information Learn more about managing your weight gain during pregnancy from:  American Pregnancy Association:  www.americanpregnancy.org  [place] Department of Agriculture pregnancy weight gain calculator: [employer] Summary  Too much weight gain during pregnancy can lead to complications for you and your baby.  Find out your pre-pregnancy BMI to determine how much weight gain is healthy for you.  Eat nutritious foods and stay active.  Keep all of your prenatal visits as told by your health care provider. This information is not intended to replace advice given to you by your health care provider. Make sure you discuss any questions you have with your health care provider. Document Released: 01/11/2017 Document Revised: 01/11/2017 Document Reviewed: 01/11/2017 Elsevier Patient Education  2020 [employer].  Preparing for Pregnancy If you are considering becoming pregnant, make an appointment to see your regular health care provider to learn how to prepare for a safe and healthy pregnancy (preconception care).  During a preconception care visit, your health care provider will:  Do a complete physical exam, including a Pap test.  Take a complete medical history.  Give you information, answer your questions, and help you resolve problems. Preconception checklist Medical history  Tell your health care provider about any current or past medical conditions. Your pregnancy or your ability to become pregnant may be affected by chronic conditions, such as diabetes, chronic hypertension, and thyroid problems.  Include your family's medical history as well as your partner's medical history.  Tell your health care provider about any history of STIs (sexually transmitted infections).These can affect your pregnancy. In some cases, they can be passed to your baby. Discuss any concerns that you have about STIs.  If indicated, discuss the benefits of genetic testing. This testing will show whether there are any genetic conditions that may be passed from you or your partner to your baby.  Tell your  health care provider about: ? Any problems you have had with conception or pregnancy. ? Any medicines you take. These include vitamins, herbal supplements, and over-the-counter medicines. ? Your history of immunizations. Discuss any vaccinations that you may need. Diet  Ask your health care provider what to include in a healthy diet that has a balance of nutrients. This is especially important when you are pregnant or preparing to become pregnant.  Ask your health care provider to help you reach a healthy weight before pregnancy. ? If you are overweight, you may be at higher risk for certain complications, such as high blood pressure, diabetes, and preterm birth. ? If you are underweight, you are more likely to have a baby who has a low birth weight. Lifestyle, work, and home  Let your health care provider know: ? About any lifestyle habits that you have, such as alcohol use, drug use, or smoking. ? About recreational activities that may put you at risk during pregnancy, such as downhill skiing and certain exercise programs. ? Tell your health care provider about any international travel, especially any travel to places with an active [place] virus outbreak. ? About harmful substances that you may be exposed to at work or at home. These include chemicals, pesticides, radiation, or even litter boxes. ? If you do not feel safe at home. Mental health  Tell your health care provider about: ? Any history of mental health conditions, including feelings of depression, sadness, or anxiety. ? Any medicines that you take for a mental health condition. These include herbs and supplements. Home instructions to prepare for pregnancy Lifestyle   Eat a balanced diet. This includes fresh fruits and vegetables, whole grains, lean meats, low-fat dairy products, healthy fats, and foods that are high in fiber. Ask to meet with a nutritionist or registered dietitian for assistance with meal planning and  goals.  Get regular exercise. Try to be active for at least 30 minutes a day on most days of the week. Ask your health care provider which activities are safe during pregnancy.  Do not use any products that contain nicotine or tobacco, such as cigarettes and e-cigarettes. If you need help quitting, ask your health care provider.  Do not drink alcohol.  Do not take illegal drugs.  Maintain a healthy weight. Ask your health care provider what weight range is right for you. General instructions  Keep an accurate record of your menstrual periods. This makes it easier for your health care provider to determine your baby's due date.  Begin taking prenatal vitamins and  folic acid supplements daily as directed by your health care provider.  Manage any chronic conditions, such as high blood pressure and diabetes, as told by your health care provider. This is important. How do I know that I am pregnant? You may be pregnant if you have been sexually active and you miss your period. Symptoms of early pregnancy include:  Mild cramping.  Very light vaginal bleeding (spotting).  Feeling unusually tired.  Nausea and vomiting (morning sickness). If you have any of these symptoms and you suspect that you might be pregnant, you can take a home pregnancy test. These tests check for a hormone in your urine (human chorionic gonadotropin, or hCG). A woman's body begins to make this hormone during early pregnancy. These tests are very accurate. Wait until at least the first day after you miss your period to take one. If the test shows that you are pregnant (you get a positive result), call your health care provider to make an appointment for prenatal care. What should I do if I become pregnant?      Make an appointment with your health care provider as soon as you suspect you are pregnant.  Do not use any products that contain nicotine, such as cigarettes, chewing tobacco, and e-cigarettes. If you need  help quitting, ask your health care provider.  Do not drink alcoholic beverages. Alcohol is related to a number of birth defects.  Avoid toxic odors and chemicals.  You may continue to have sexual intercourse if it does not cause pain or other problems, such as vaginal bleeding. This information is not intended to replace advice given to you by your health care provider. Make sure you discuss any questions you have with your health care provider. Document Released: 04/05/2008 Document Revised: 04/25/2017 Document Reviewed: 11/13/2015 Elsevier Patient Education  2020 [employer].

## 2019-04-29 NOTE — Progress Notes (Signed)
GYNECOLOGY OFFICE VISIT NOTE   History:  28 y.o. G1P1001 here today for GYN visit.  Just saw her PCP today as currently lives in [place] and is visiting in town for the holidays. Denies any chronic illnesses or medical conditions.  PCP did see her for exam without breast exam and without pelvic exam today.  Had blood drawn for lipids at PCP office. Did not have STD testing or STD lab work done at that previous visit.  Did not every use the [place].  Partner has had Covid in November.  Interested in conceiving in 2021. 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  She denies any abnormal vaginal discharge, bleeding, pelvic pain or other concerns.   Past Medical History:  Diagnosis Date  . Arthritis   . Bipolar 2 disorder ([place])   . Depression   . Sleep apnea   . STI (sexually transmitted infection)     Past Surgical History:  Procedure Laterality Date  . I & D EXTREMITY  04/02/2012   Procedure: IRRIGATION AND DEBRIDEMENT EXTREMITY;  Surgeon: Johnn Hai, MD;  Location: [place];  Service: Orthopedics;  Laterality: Left;  . I & D EXTREMITY  04/04/2012   Procedure: IRRIGATION AND DEBRIDEMENT EXTREMITY;  Surgeon: Johnn Hai, MD;  Location: [place];  Service: Orthopedics;  Laterality: Left;  . NO PAST SURGERIES    . ORIF ANKLE FRACTURE  04/02/2012   Procedure: OPEN REDUCTION INTERNAL FIXATION (ORIF) ANKLE FRACTURE;  Surgeon: Johnn Hai, MD;  Location: [place];  Service: Orthopedics;  Laterality: Left;    The following portions of the patient's history were reviewed and updated as appropriate: allergies, current medications, past family history, past medical history, past social history, past surgical history and problem list.   Health Maintenance:  Normal pap and negative HRHPV on 06-03-18.  Scheduled for mammogram through PCP office due to breast pain.  Review of  Systems:  Pertinent items noted in HPI and remainder of comprehensive ROS otherwise negative.  Objective:  Physical Exam BP 126/86 (BP Location: Right Arm, Patient Position: Sitting, Cuff Size: Large)   Pulse 99   Temp 98.1 F (36.7 C) (Oral)   Ht 5\' 7"  (1.702 m)   Wt 228 lb (103.4 kg)   LMP 04/21/2019 (Approximate)   BMI 35.71 kg/m  CONSTITUTIONAL: Well-developed, well-nourished female in no acute distress.  HENT:  Normocephalic, atraumatic. External right and left ear normal.  EYES: Conjunctivae and EOM are normal. Pupils are equal, round.  No scleral icterus.  NECK: Normal range of motion, supple, no masses SKIN: Skin is warm and dry. No rash noted. Not diaphoretic. No erythema. No pallor. BREAST:  No masses bilaterally, no nipple discharge, no tenderness on exam today NEUROLOGIC: Alert and oriented to person, place, and time. Normal muscle tone coordination. No cranial nerve deficit noted. PSYCHIATRIC: Normal mood and affect. Normal behavior. Normal judgment and thought content. CARDIOVASCULAR: Normal heart rate noted RESPIRATORY: Effort and breath sounds normal, no problems with respiration noted ABDOMEN: Soft, no distention noted.   PELVIC: Small amount of discharge seen at introitus.  Small amount of thin white discharge, no odor, slight bleeding with cervical swab, nontender on binmanual exam MUSCULOSKELETAL: Normal range of motion. No edema noted.  Labs and Imaging No results found.  Assessment & Plan:  1. Women's annual routine gynecological examination Has not been one  year yet since last full physical exam, but client is in town and wanted to be seen in this office.  - Cervicovaginal ancillary only( [place]) - HIV antibody (with reflex) - RPR  2. Screen for STD (sexually transmitted disease)  - Cervicovaginal ancillary only( [place]) - HIV antibody (with reflex) - RPR  3. Adult BMI 35.0-35.9 kg/sq m Weight loss discussed.  Client is interested in 10%  weight loss prior to conceiving.  Current BP is no high.  Client states several plans she has for achieving weight loss - decrease alcohol consumption, increase water intake, increase outdoor walking for exercise, pay attention to intake.  Also discussed [employer] as appropriate option to help if she desires.   Routine preventative health maintenance measures emphasized. Please refer to After Visit Summary for other counseling recommendations.   Return in about 1 year (around 04/28/2020).   Total face-to-face time with patient: 15 minutes.  Over 50% of encounter was spent on counseling and coordination of care.  Nolene Bernheim, RN, MSN, NP-BC Nurse Practitioner, [facility] for [employer], [facility] 04/29/2019 4:42 PM

## 2019-04-30 LAB — RPR: RPR Ser Ql: NONREACTIVE

## 2019-04-30 LAB — HIV ANTIBODY (ROUTINE TESTING W REFLEX): HIV Screen 4th Generation wRfx: NONREACTIVE

## 2019-05-05 LAB — CERVICOVAGINAL ANCILLARY ONLY
Bacterial Vaginitis (gardnerella): POSITIVE — AB
Candida Glabrata: NEGATIVE
Candida Vaginitis: NEGATIVE
Chlamydia: NEGATIVE
Comment: NEGATIVE
Comment: NEGATIVE
Comment: NEGATIVE
Comment: NEGATIVE
Comment: NEGATIVE
Comment: NORMAL
Neisseria Gonorrhea: NEGATIVE
Trichomonas: NEGATIVE

## 2019-05-05 MED ORDER — METRONIDAZOLE 500 MG PO TABS: 500.0000 mg | ORAL_TABLET | Freq: Two times a day (BID) | ORAL | 0 refills | Status: DC

## 2019-05-05 NOTE — Addendum Note (Signed)
Addended by: Virginia Rochester on: 05/05/2019 03:02 PM   Modules accepted: Orders

## 2020-11-16 ENCOUNTER — Encounter: Payer: Self-pay | Admitting: Family Medicine

## 2020-11-16 ENCOUNTER — Ambulatory Visit (INDEPENDENT_AMBULATORY_CARE_PROVIDER_SITE_OTHER): Payer: BLUE CROSS/BLUE SHIELD | Admitting: Family Medicine

## 2020-11-16 ENCOUNTER — Other Ambulatory Visit: Payer: Self-pay

## 2020-11-16 VITALS — BP 124/88 | HR 84 | Temp 98.3°F | Wt 242.2 lb

## 2020-11-16 DIAGNOSIS — R112 Nausea with vomiting, unspecified: Secondary | ICD-10-CM | POA: Diagnosis not present

## 2020-11-16 DIAGNOSIS — R58 Hemorrhage, not elsewhere classified: Secondary | ICD-10-CM | POA: Diagnosis not present

## 2020-11-16 DIAGNOSIS — K219 Gastro-esophageal reflux disease without esophagitis: Secondary | ICD-10-CM

## 2020-11-16 DIAGNOSIS — F439 Reaction to severe stress, unspecified: Secondary | ICD-10-CM | POA: Diagnosis not present

## 2020-11-16 DIAGNOSIS — F419 Anxiety disorder, unspecified: Secondary | ICD-10-CM | POA: Diagnosis not present

## 2020-11-16 DIAGNOSIS — K582 Mixed irritable bowel syndrome: Secondary | ICD-10-CM | POA: Diagnosis not present

## 2020-11-16 DIAGNOSIS — R1310 Dysphagia, unspecified: Secondary | ICD-10-CM

## 2020-11-16 LAB — COMPREHENSIVE METABOLIC PANEL
ALT: 10 U/L (ref 0–35)
AST: 13 U/L (ref 0–37)
Albumin: 3.9 g/dL (ref 3.5–5.2)
Alkaline Phosphatase: 56 U/L (ref 39–117)
BUN: 6 mg/dL (ref 6–23)
CO2: 25 mEq/L (ref 19–32)
Calcium: 9.2 mg/dL (ref 8.4–10.5)
Chloride: 104 mEq/L (ref 96–112)
Creatinine, Ser: 0.57 mg/dL (ref 0.40–1.20)
GFR: 121.31 mL/min (ref >60.00)
Glucose, Bld: 92 mg/dL (ref 70–99)
Potassium: 4.2 mEq/L (ref 3.5–5.1)
Sodium: 137 mEq/L (ref 135–145)
Total Bilirubin: 0.9 mg/dL (ref 0.2–1.2)
Total Protein: 6.9 g/dL (ref 6.0–8.3)

## 2020-11-16 LAB — CBC WITH DIFFERENTIAL/PLATELET
Basophils Absolute: 0 10*3/uL (ref 0.0–0.1)
Basophils Relative: 0.2 % (ref 0.0–3.0)
Eosinophils Absolute: 0 10*3/uL (ref 0.0–0.7)
Eosinophils Relative: 0.9 % (ref 0.0–5.0)
HCT: 36.7 % (ref 36.0–46.0)
Hemoglobin: 12.4 g/dL (ref 12.0–15.0)
Lymphocytes Relative: 39 % (ref 12.0–46.0)
Lymphs Abs: 1.9 10*3/uL (ref 0.7–4.0)
MCHC: 33.8 g/dL (ref 30.0–36.0)
MCV: 96.8 fl (ref 78.0–100.0)
Monocytes Absolute: 0.3 10*3/uL (ref 0.1–1.0)
Monocytes Relative: 7 % (ref 3.0–12.0)
Neutro Abs: 2.6 10*3/uL (ref 1.4–7.7)
Neutrophils Relative %: 52.9 % (ref 43.0–77.0)
Platelets: 305 10*3/uL (ref 150.0–400.0)
RBC: 3.79 Mil/uL — ABNORMAL LOW (ref 3.87–5.11)
RDW: 14.9 % (ref 11.5–15.5)
WBC: 4.9 10*3/uL (ref 4.0–10.5)

## 2020-11-16 LAB — HEMOGLOBIN A1C: Hgb A1c MFr Bld: 5.4 % (ref 4.6–6.5)

## 2020-11-16 LAB — T4, FREE: Free T4: 0.9 ng/dL (ref 0.60–1.60)

## 2020-11-16 LAB — VITAMIN B12: Vitamin B-12: 325 pg/mL (ref 211–911)

## 2020-11-16 LAB — TSH: TSH: 0.86 u[IU]/mL (ref 0.35–5.50)

## 2020-11-16 LAB — VITAMIN D 25 HYDROXY (VIT D DEFICIENCY, FRACTURES): VITD: 12.15 ng/mL — ABNORMAL LOW (ref 30.00–100.00)

## 2020-11-16 MED ORDER — PANTOPRAZOLE SODIUM 20 MG PO TBEC: 20.0000 mg | DELAYED_RELEASE_TABLET | Freq: Every day | ORAL | 3 refills | Status: DC

## 2020-11-16 NOTE — Progress Notes (Signed)
Subjective:    Patient ID: Nichole Page, female    DOB: 01-09-1991, 30 y.o.   MRN: [id number]  Chief Complaint  Patient presents with   Abdominal Pain    Cant old food down, always comes back up. Going on for 2 months, vomiting, constipation, or diarrhea, no appetite. Soups, cucumbers, smoothies can be held down, mostly soft foods.    HPI Patient was seen today to re-establish care and for ongoing concerns.  Pt recently moved back to the area form [place].  Pt having pressure in chest, a "pinch" in epigastric area, n/v, intermittent constipation and diarrhea.  Started having emesis 2x/d in May.  Occurs with most solid foods and meat. Chicken, salmon, and veggies if soft are ok.  Symptoms started when stress increased.  Stress has improved.  Just found out 9 siblings have IBS.  Taking neurotec for migraines.    Past Medical History:  Diagnosis Date   Arthritis    Bipolar 2 disorder (HCC)    Depression    Sleep apnea    STI (sexually transmitted infection)     Allergies  Allergen Reactions   Apple Juice Nausea And Vomiting and Other (See Comments)    Eyes swell    ROS General: Denies fever, chills, night sweats, changes in weight, changes in appetite +stress HEENT: Denies headaches, ear pain, changes in vision, rhinorrhea, sore throat CV: Denies CP, palpitations, SOB, orthopnea Pulm: Denies SOB, cough, wheezing GI: Denies abdominal pain  + nausea, vomiting, diarrhea, constipation GU: Denies dysuria, hematuria, frequency, vaginal discharge Msk: Denies muscle cramps, joint pains Neuro: Denies weakness, numbness, tingling Skin: Denies rashes, bruising Psych: Denies depression, anxiety, hallucinations    Objective:    Blood pressure 124/88, pulse 84, temperature 98.3 F (36.8 C), temperature source Oral, weight 242 lb 3.2 oz (109.9 kg), SpO2 (!) 84 %.  Gen. Pleasant, well-nourished, in no distress on RA, normal affect   HEENT: [place]/AT, face symmetric, conjunctiva clear,  no scleral icterus, PERRLA, EOMI, nares patent without drainage Lungs: no accessory muscle use, CTAB, no wheezes or rales Cardiovascular: RRR, no m/r/g, no peripheral edema Abdomen: BS present, soft, NT/ND. Musculoskeletal: No deformities, no cyanosis or clubbing, normal tone Neuro:  A&Ox3, CN II-XII intact, normal gait Skin:  Warm, no lesions/ rash.  Acrylic fingernails.  Wt Readings from Last 3 Encounters:  11/16/20 242 lb 3.2 oz (109.9 kg)  04/29/19 228 lb (103.4 kg)  04/29/19 226 lb (102.5 kg)    Lab Results  Component Value Date   WBC 5.9 04/29/2019   HGB 13.8 04/29/2019   HCT 41.4 04/29/2019   PLT 482.0 (H) 04/29/2019   GLUCOSE 93 04/29/2019   CHOL 155 04/29/2019   TRIG 79.0 04/29/2019   HDL 61.00 04/29/2019   LDLCALC 78 04/29/2019   ALT 8 12/30/2015   AST 9 12/30/2015   NA 139 04/29/2019   K 3.9 04/29/2019   CL 102 04/29/2019   CREATININE 0.68 04/29/2019   BUN 9 04/29/2019   CO2 27 04/29/2019   TSH 1.03 04/29/2019   INR 0.92 04/02/2012   HGBA1C 5.4 04/29/2019    Assessment/Plan: SpO2 inaccurate 2/2 pt's acrylic fingernails.  Anxiety  -GAD 7 score 20 -PHQ 9 14 -discussed treatment options including medications and counseling.  Pt wishes to wait on medication. -given info for area [facility] providers - Plan: TSH, T4, Free, Vitamin D, 25-hydroxy, Vitamin B12  Irritable bowel syndrome with both constipation and diarrhea  -Low FODMAP diet - Plan: Ambulatory referral to Gastroenterology  Non-intractable vomiting with nausea, unspecified vomiting type  -discussed possible causes including GERD, IBS, gastritis,  - Plan: Ambulatory referral to Gastroenterology  Stress  -improving -discussed self care -consider counseling - Plan: Vitamin D, 25-hydroxy  Ecchymosis  - Plan: CMP, CBC with Differential/Platelet, TSH, T4, Free  Dysphagia, unspecified type  -small bites and other precautions -start protonix as GERD likely contributing - Plan: Ambulatory referral to  Gastroenterology, Hemoglobin A1c, Vitamin D, 25-hydroxy, pantoprazole (PROTONIX) 20 MG tablet  Gastroesophageal reflux disease, unspecified whether esophagitis present  - Plan: pantoprazole (PROTONIX) 20 MG tablet  F/u in 2-4 wks  Abbe Amsterdam, MD

## 2020-11-18 ENCOUNTER — Other Ambulatory Visit: Payer: Self-pay | Admitting: Family Medicine

## 2020-11-18 DIAGNOSIS — E559 Vitamin D deficiency, unspecified: Secondary | ICD-10-CM

## 2020-11-18 MED ORDER — VITAMIN D (ERGOCALCIFEROL) 1.25 MG (50000 UNIT) PO CAPS: 50000.0000 [IU] | ORAL_CAPSULE | ORAL | 0 refills | Status: DC

## 2021-06-07 ENCOUNTER — Other Ambulatory Visit: Payer: Self-pay | Admitting: Family Medicine

## 2021-06-07 DIAGNOSIS — E559 Vitamin D deficiency, unspecified: Secondary | ICD-10-CM

## 2021-08-29 ENCOUNTER — Ambulatory Visit: Payer: BLUE CROSS/BLUE SHIELD | Admitting: Obstetrics and Gynecology

## 2021-12-08 ENCOUNTER — Ambulatory Visit (INDEPENDENT_AMBULATORY_CARE_PROVIDER_SITE_OTHER): Payer: BLUE CROSS/BLUE SHIELD | Admitting: Family Medicine

## 2021-12-08 VITALS — BP 130/88 | HR 76 | Temp 98.0°F | Ht 66.75 in | Wt 211.8 lb

## 2021-12-08 DIAGNOSIS — E559 Vitamin D deficiency, unspecified: Secondary | ICD-10-CM

## 2021-12-08 DIAGNOSIS — R1084 Generalized abdominal pain: Secondary | ICD-10-CM | POA: Diagnosis not present

## 2021-12-08 DIAGNOSIS — R5383 Other fatigue: Secondary | ICD-10-CM

## 2021-12-08 DIAGNOSIS — R195 Other fecal abnormalities: Secondary | ICD-10-CM

## 2021-12-08 DIAGNOSIS — Z803 Family history of malignant neoplasm of breast: Secondary | ICD-10-CM

## 2021-12-08 DIAGNOSIS — M25473 Effusion, unspecified ankle: Secondary | ICD-10-CM | POA: Diagnosis not present

## 2021-12-08 DIAGNOSIS — Z0001 Encounter for general adult medical examination with abnormal findings: Secondary | ICD-10-CM | POA: Diagnosis not present

## 2021-12-08 DIAGNOSIS — G8929 Other chronic pain: Secondary | ICD-10-CM

## 2021-12-08 DIAGNOSIS — R634 Abnormal weight loss: Secondary | ICD-10-CM

## 2021-12-08 DIAGNOSIS — M25572 Pain in left ankle and joints of left foot: Secondary | ICD-10-CM

## 2021-12-08 DIAGNOSIS — Z0000 Encounter for general adult medical examination without abnormal findings: Secondary | ICD-10-CM

## 2021-12-08 LAB — COMPREHENSIVE METABOLIC PANEL
ALT: 12 U/L (ref 0–35)
AST: 17 U/L (ref 0–37)
Albumin: 4.2 g/dL (ref 3.5–5.2)
Alkaline Phosphatase: 52 U/L (ref 39–117)
BUN: 8 mg/dL (ref 6–23)
CO2: 29 mEq/L (ref 19–32)
Calcium: 9.5 mg/dL (ref 8.4–10.5)
Chloride: 104 mEq/L (ref 96–112)
Creatinine, Ser: 0.59 mg/dL (ref 0.40–1.20)
GFR: 119.41 mL/min (ref >60.00)
Glucose, Bld: 82 mg/dL (ref 70–99)
Potassium: 4.4 mEq/L (ref 3.5–5.1)
Sodium: 139 mEq/L (ref 135–145)
Total Bilirubin: 0.5 mg/dL (ref 0.2–1.2)
Total Protein: 7.6 g/dL (ref 6.0–8.3)

## 2021-12-08 LAB — CBC WITH DIFFERENTIAL/PLATELET
Basophils Absolute: 0 10*3/uL (ref 0.0–0.1)
Basophils Relative: 0.4 % (ref 0.0–3.0)
Eosinophils Absolute: 0 10*3/uL (ref 0.0–0.7)
Eosinophils Relative: 0.6 % (ref 0.0–5.0)
HCT: 38.8 % (ref 36.0–46.0)
Hemoglobin: 13.1 g/dL (ref 12.0–15.0)
Lymphocytes Relative: 47.7 % — ABNORMAL HIGH (ref 12.0–46.0)
Lymphs Abs: 2.7 10*3/uL (ref 0.7–4.0)
MCHC: 33.7 g/dL (ref 30.0–36.0)
MCV: 100.2 fl — ABNORMAL HIGH (ref 78.0–100.0)
Monocytes Absolute: 0.4 10*3/uL (ref 0.1–1.0)
Monocytes Relative: 7.2 % (ref 3.0–12.0)
Neutro Abs: 2.5 10*3/uL (ref 1.4–7.7)
Neutrophils Relative %: 44.1 % (ref 43.0–77.0)
Platelets: 309 10*3/uL (ref 150.0–400.0)
RBC: 3.87 Mil/uL (ref 3.87–5.11)
RDW: 12.6 % (ref 11.5–15.5)
WBC: 5.6 10*3/uL (ref 4.0–10.5)

## 2021-12-08 LAB — VITAMIN D 25 HYDROXY (VIT D DEFICIENCY, FRACTURES): VITD: 21.93 ng/mL — ABNORMAL LOW (ref 30.00–100.00)

## 2021-12-08 LAB — LIPID PANEL
Cholesterol: 182 mg/dL (ref 0–200)
HDL: 83.8 mg/dL (ref >39.00)
LDL Cholesterol: 83 mg/dL (ref 0–99)
NonHDL: 98.58
Total CHOL/HDL Ratio: 2
Triglycerides: 79 mg/dL (ref 0.0–149.0)
VLDL: 15.8 mg/dL (ref 0.0–40.0)

## 2021-12-08 LAB — TSH: TSH: 0.58 u[IU]/mL (ref 0.35–5.50)

## 2021-12-08 LAB — HEMOGLOBIN A1C: Hgb A1c MFr Bld: 5.4 % (ref 4.6–6.5)

## 2021-12-08 LAB — T4, FREE: Free T4: 0.79 ng/dL (ref 0.60–1.60)

## 2021-12-08 LAB — VITAMIN B12: Vitamin B-12: 319 pg/mL (ref 211–911)
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
## 2021-12-08 NOTE — Patient Instructions (Signed)
I placed an order for mammogram but you can also call to check on scheduling this.  Referrals were placed for gastroenterology and podiatry.  You should expect a phone call about scheduling these appointments.

## 2021-12-14 ENCOUNTER — Other Ambulatory Visit: Payer: Self-pay | Admitting: Family Medicine

## 2021-12-14 DIAGNOSIS — E559 Vitamin D deficiency, unspecified: Secondary | ICD-10-CM

## 2021-12-14 MED ORDER — VITAMIN D (ERGOCALCIFEROL) 1.25 MG (50000 UNIT) PO CAPS: 50000.0000 [IU] | ORAL_CAPSULE | ORAL | 0 refills | Status: DC

## 2021-12-25 ENCOUNTER — Ambulatory Visit: Payer: Self-pay | Admitting: Podiatry

## 2022-01-04 ENCOUNTER — Ambulatory Visit
Admission: RE | Admit: 2022-01-04 | Discharge: 2022-01-04 | Disposition: A | Discharge status: Home / Self Care | Payer: BLUE CROSS/BLUE SHIELD | Source: Ambulatory Visit | Attending: Family Medicine | Admitting: Family Medicine

## 2022-01-04 DIAGNOSIS — Z803 Family history of malignant neoplasm of breast: Secondary | ICD-10-CM

## 2022-01-19 ENCOUNTER — Ambulatory Visit: Payer: BLUE CROSS/BLUE SHIELD | Admitting: Family Medicine

## 2022-01-22 ENCOUNTER — Ambulatory Visit (INDEPENDENT_AMBULATORY_CARE_PROVIDER_SITE_OTHER): Payer: BLUE CROSS/BLUE SHIELD

## 2022-01-22 ENCOUNTER — Ambulatory Visit (INDEPENDENT_AMBULATORY_CARE_PROVIDER_SITE_OTHER): Payer: BLUE CROSS/BLUE SHIELD | Admitting: Orthopaedic Surgery

## 2022-01-22 DIAGNOSIS — M25572 Pain in left ankle and joints of left foot: Secondary | ICD-10-CM

## 2022-01-22 DIAGNOSIS — M8700 Idiopathic aseptic necrosis of unspecified bone: Secondary | ICD-10-CM | POA: Diagnosis not present

## 2022-01-22 NOTE — Progress Notes (Signed)
Chief Complaint: Left ankle pain     History of Present Illness:    Nichole Page is a 31 y.o. female presents today for follow-up of her left ankle.  She was initially seeing Dr. Tonita Cong in 2013 for an extruded left talus for which she subsequently went open reduction internal fixation of both this and fibula.  Postoperatively she appears to have suffered a vast necrosis of the tibia and is in significant pain about the joint with most activities.  Despite this she is remarkably active.  She does have 3 dogs that she takes for walks around the park.  She is able to walk with essentially a nonantalgic gait although she can experiences pain about the joint with persistent numbness over the dorsal aspect of the foot.  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  She does work remotely.  She enjoys beer.   Surgical History:   As above  PMH/PSH/Family History/Social History/Meds/Allergies:    Past Medical History:  Diagnosis Date   Arthritis    Bipolar 2 disorder ([place])    Depression    Sleep apnea    STI (sexually transmitted infection)    Past Surgical History:  Procedure Laterality Date   I & D EXTREMITY  04/02/2012   Procedure: IRRIGATION AND DEBRIDEMENT EXTREMITY;  Surgeon: Johnn Hai, MD;  Location: [place];  Service: Orthopedics;  Laterality: Left;   I & D EXTREMITY  04/04/2012   Procedure: IRRIGATION AND DEBRIDEMENT EXTREMITY;  Surgeon: Johnn Hai, MD;  Location: [place];  Service: Orthopedics;  Laterality: Left;   NO PAST SURGERIES     ORIF ANKLE FRACTURE  04/02/2012   Procedure: OPEN REDUCTION INTERNAL FIXATION (ORIF) ANKLE FRACTURE;  Surgeon: Johnn Hai, MD;  Location: [place];  Service: Orthopedics;  Laterality: Left;   Social History   Socioeconomic History   Marital status: Single    Spouse name: Not on file   Number of children: 1   Years of education: Not on file   Highest education level: Not on file  Occupational  History   Not on file  Tobacco Use   Smoking status: Former   Smokeless tobacco: Former  Scientific laboratory technician Use: Never used  Substance and Sexual Activity   Alcohol use: Yes    Alcohol/week: 3.0 standard drinks of alcohol    Types: 3 Glasses of wine per week    Comment: pt states she drinks a few times a week   Drug use: No    Frequency: 1.0 times per week   Sexual activity: Yes    Partners: Male, Female    Birth control/protection: None  Other Topics Concern   Not on file  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[employer] [place]   Social Determinants of Health   Financial Resource Strain: Not on file  Food Insecurity: Not on file  Transportation Needs: Not on file  Physical Activity: Not on file  Stress: Not on file  Social Connections: Somewhat Isolated (01/24/2018)   Social Connection and Isolation Panel [NHANES]    Frequency of Communication with Friends and Family: More than three times a week    Frequency of Social Gatherings with Friends and Family: Never    Attends Religious Services: 1 to 4 times  per year    Active Member of Clubs or Organizations: No    Attends Archivist Meetings: Never    Marital Status: Never married   Family History  Problem Relation Age of Onset   Diabetes Father    Stroke Father    Alcohol abuse Father    Arthritis Father    Cerebrovascular Accident Father    Fibromyalgia Mother    Alcohol abuse Mother    Arthritis Mother    Breast cancer Sister 87       half sister    Breast cancer Maternal Aunt 48   Breast cancer Paternal Aunt    Breast cancer Maternal Grandmother        early 35s   Allergies  Allergen Reactions   Apple Juice Nausea And Vomiting and Other (See Comments)    Eyes swell   Current Outpatient Medications  Medication Sig Dispense Refill   metroNIDAZOLE (FLAGYL) 500 MG tablet Take 1 tablet (500 mg total) by mouth 2 (two) times daily. No alcohol while taking this medication  (Patient not taking: Reported on 12/08/2021) 14 tablet 0   pantoprazole (PROTONIX) 20 MG tablet Take 1 tablet (20 mg total) by mouth daily. (Patient not taking: Reported on 12/08/2021) 30 tablet 3   Vitamin D, Ergocalciferol, (DRISDOL) 1.25 MG (50000 UNIT) CAPS capsule Take 1 capsule (50,000 Units total) by mouth every 7 (seven) days. 12 capsule 0   No current facility-administered medications for this visit.   No results found.  Review of Systems:   A ROS was performed including pertinent positives and negatives as documented in the HPI.  Physical Exam :   Constitutional: NAD and appears stated age Neurological: Alert and oriented Psych: Appropriate affect and cooperative There were no vitals taken for this visit.   Comprehensive Musculoskeletal Exam:    There is tenderness to palpation about the tibiotalar joint.  Previous incisions are well-appearing.  There is no redness or erythema about the joint.  She has 20 degrees of dorsi and plantarflexion.  She has pain with this.  She has pain with inversion and eversion.  Imaging:   Xray (3 views left ankle): Status post fibula intramedullary device placement which is healed.  There does appear to be a vast necrosis involving the entire talar surface.  There is a medial malleolar ossification consistent with previous fracture.  MRI (left ankle from 2016): There does appear to be avascular necrosis involving the majority of the talar head  I personally reviewed and interpreted the radiographs.   Assessment:   31 y.o. 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 Tonita Cong.  Today I described the given the fact that she has such severe talar necrosis but I do not necessarily believe she would be a candidate for cartilage preservation procedure or any type of allograft.  To that effect and given her very good motion and desire to stay  quite active I do believe that she may ultimately be a candidate for a total ankle arthroplasty.  To that effect I would like to plan for referral to Dr. Doran Durand for discussion of this  Plan :    -Plan for left ankle arthroplasty referral to Dr. Doran Durand     I personally saw and evaluated the patient, and participated in the management and treatment plan.  Vanetta Mulders, MD Attending Physician, Orthopedic Surgery  This document was  dictated using Systems analyst. A reasonable attempt at proof reading has been made to minimize errors.

## 2022-01-24 ENCOUNTER — Ambulatory Visit: Payer: Self-pay | Admitting: Podiatry

## 2022-02-13 ENCOUNTER — Other Ambulatory Visit (HOSPITAL_COMMUNITY)
Admission: RE | Admit: 2022-02-13 | Discharge: 2022-02-13 | Disposition: A | Discharge status: Home / Self Care | Payer: BLUE CROSS/BLUE SHIELD | Source: Ambulatory Visit | Attending: Obstetrics & Gynecology | Admitting: Obstetrics & Gynecology

## 2022-02-13 ENCOUNTER — Encounter: Payer: Self-pay | Admitting: Obstetrics & Gynecology

## 2022-02-13 ENCOUNTER — Ambulatory Visit (INDEPENDENT_AMBULATORY_CARE_PROVIDER_SITE_OTHER): Payer: BLUE CROSS/BLUE SHIELD | Admitting: Obstetrics & Gynecology

## 2022-02-13 VITALS — BP 128/87 | HR 76 | Ht 66.0 in | Wt 216.0 lb

## 2022-02-13 DIAGNOSIS — F3181 Bipolar II disorder: Secondary | ICD-10-CM

## 2022-02-13 DIAGNOSIS — Z113 Encounter for screening for infections with a predominantly sexual mode of transmission: Secondary | ICD-10-CM

## 2022-02-13 DIAGNOSIS — Z01419 Encounter for gynecological examination (general) (routine) without abnormal findings: Secondary | ICD-10-CM

## 2022-02-13 NOTE — Progress Notes (Signed)
Pt presents for AEX. Last PAP 2019. Pt requesting STD testing. No other concerns at this time.   Pt scored high on PHQ9 and GAD7. Pt has hx of Bipolar 2 that is not being managed.

## 2022-02-13 NOTE — Progress Notes (Signed)
GYNECOLOGY ANNUAL PREVENTATIVE CARE ENCOUNTER NOTE  History:     Nichole Page is a 31 y.o. G35P1001 female here for a routine annual gynecologic exam.  Current complaints: none.   Denies abnormal vaginal bleeding, discharge, pelvic pain, problems with intercourse or other gynecologic concerns.    Gynecologic History Patient's last menstrual period was 02/07/2022 (approximate). Contraception: condoms Last Pap: 9/20/20219. Result was normal with negative HPV Last Mammogram: 01/04/2022.  Result was normal, done for stro\ng family history of breast cancer   Obstetric History OB History  Gravida Para Term Preterm AB Living  1 1 1     1   SAB IAB Ectopic Multiple Live Births          1    # Outcome Date GA Lbr Len/2nd Weight Sex Delivery Anes PTL Lv  1 Term 11/25/04    F Vag-Spont   LIV    Past Medical History:  Diagnosis Date   Arthritis    Bipolar 2 disorder ([place])    Depression    Sleep apnea    STI (sexually transmitted infection)     Past Surgical History:  Procedure Laterality Date   I & D EXTREMITY  04/02/2012   Procedure: IRRIGATION AND DEBRIDEMENT EXTREMITY;  Surgeon: Johnn Hai, MD;  Location: [place];  Service: Orthopedics;  Laterality: Left;   I & D EXTREMITY  04/04/2012   Procedure: IRRIGATION AND DEBRIDEMENT EXTREMITY;  Surgeon: Johnn Hai, MD;  Location: [place];  Service: Orthopedics;  Laterality: Left;   NO PAST SURGERIES     ORIF ANKLE FRACTURE  04/02/2012   Procedure: OPEN REDUCTION INTERNAL FIXATION (ORIF) ANKLE FRACTURE;  Surgeon: Johnn Hai, MD;  Location: [place];  Service: Orthopedics;  Laterality: Left;    Current Outpatient Medications on File Prior to Visit  Medication Sig Dispense Refill   Vitamin D, Ergocalciferol, (DRISDOL) 1.25 MG (50000 UNIT) CAPS capsule Take 1 capsule (50,000 Units total) by mouth every 7 (seven) days. 12 capsule 0   metroNIDAZOLE (FLAGYL) 500 MG tablet Take 1 tablet (500 mg total) by mouth 2 (two) times  daily. No alcohol while taking this medication (Patient not taking: Reported on 12/08/2021) 14 tablet 0   pantoprazole (PROTONIX) 20 MG tablet Take 1 tablet (20 mg total) by mouth daily. (Patient not taking: Reported on 12/08/2021) 30 tablet 3   No current facility-administered medications on file prior to visit.    Allergies  Allergen Reactions   Apple Juice Nausea And Vomiting and Other (See Comments)    Eyes swell    Social History:  reports that she has quit smoking. She has quit using smokeless tobacco. She reports current alcohol use of about 3.0 standard drinks of alcohol per week. She reports that she does not use drugs.  Family History  Problem Relation Age of Onset   Diabetes Father    Stroke Father    Alcohol abuse Father    Arthritis Father    Cerebrovascular Accident Father    Fibromyalgia Mother    Alcohol abuse Mother    Arthritis Mother    Breast cancer Sister 67       half sister    Breast cancer Maternal Aunt 60   Breast cancer Paternal 42    Breast cancer Maternal Grandmother        early 62s    The following portions of the patient's history were reviewed and updated as appropriate: allergies, current medications, past family history, past medical history,  past social history, past surgical history and problem list.  Review of Systems Pertinent items noted in HPI and remainder of comprehensive ROS otherwise negative.  Physical Exam:  BP 128/87   Pulse 76   Ht 5\' 6"  (1.676 m)   Wt 216 lb (98 kg)   LMP 02/07/2022 (Approximate)   BMI 34.86 kg/m  CONSTITUTIONAL: Well-developed, well-nourished female in no acute distress.  HENT:  Normocephalic, atraumatic, External right and left ear normal.  EYES: Conjunctivae and EOM are normal. Pupils are equal, round, and reactive to light. No scleral icterus.  NECK: Normal range of motion, supple, no masses.  Normal thyroid.  SKIN: Skin is warm and dry. No rash noted. Not diaphoretic. No erythema. No pallor. Multiple  tattoos. MUSCULOSKELETAL: Normal range of motion. No tenderness.  No cyanosis, clubbing, or edema. NEUROLOGIC: Alert and oriented to person, place, and time. Normal reflexes, muscle tone coordination.  PSYCHIATRIC: Normal mood and affect. Normal behavior. Normal judgment and thought content. CARDIOVASCULAR: Normal heart rate noted, regular rhythm RESPIRATORY: Clear to auscultation bilaterally. Effort and breath sounds normal, no problems with respiration noted. BREASTS: Symmetric in size. Pierced nipples bilaterally. No masses, tenderness, skin changes, nipple drainage, or lymphadenopathy bilaterally. Performed in the presence of a chaperone. ABDOMEN: Soft, no distention noted.  No tenderness, rebound or guarding.  PELVIC: Normal appearing external genitalia and urethral meatus; normal appearing vaginal mucosa and cervix and prominent ectropion.  No abnormal vaginal discharge noted.  Pap smear obtained with significant bleeding, had to ameliorate with pressure from fox swabs and application of sliver nitrate around ectropion.  Normal uterine size, no other palpable masses, no uterine or adnexal tenderness.  Performed in the presence of a chaperone.   Assessment and Plan:     1. Bipolar 2 disorder [facility]) Patient  verbally consented to Integrated Behavioral Health services about her Bipolar disorder. Had elevated PHQ-9 and GAD-7 scores today, no SI/HI.  - Ambulatory referral to Integrated Behavioral Health  2. Routine screening for STI (sexually transmitted infection) STI screen done, will follow up results and manage accordingly. - Cervicovaginal ancillary only( [place]) - RPR+HBsAg+HCVAb+HIV  3. Well woman exam with routine gynecological exam - Cytology - PAP( [place]) Will follow up results of pap smear and manage accordingly. Mammogram is up to date. Routine preventative health maintenance measures emphasized. Please refer to After Visit Summary for other counseling  recommendations.      [facility], MD, FACOG Obstetrician & Gynecologist, [facility] for [facility]

## 2022-02-14 LAB — CERVICOVAGINAL ANCILLARY ONLY
Chlamydia: NEGATIVE
Comment: NEGATIVE
Comment: NEGATIVE
Comment: NORMAL
Neisseria Gonorrhea: NEGATIVE
Trichomonas: NEGATIVE

## 2022-02-14 LAB — RPR+HBSAG+HCVAB+...
HIV Screen 4th Generation wRfx: NONREACTIVE
Hep C Virus Ab: NONREACTIVE
Hepatitis B Surface Ag: NEGATIVE
RPR Ser Ql: NONREACTIVE

## 2022-02-14 LAB — CYTOLOGY - PAP
Comment: NEGATIVE
Diagnosis: NEGATIVE
High risk HPV: NEGATIVE

## 2022-04-02 ENCOUNTER — Ambulatory Visit: Payer: BLUE CROSS/BLUE SHIELD | Admitting: Family Medicine

## 2022-04-02 ENCOUNTER — Other Ambulatory Visit: Payer: Self-pay

## 2022-04-02 ENCOUNTER — Other Ambulatory Visit (HOSPITAL_BASED_OUTPATIENT_CLINIC_OR_DEPARTMENT_OTHER): Payer: Self-pay

## 2022-04-02 ENCOUNTER — Emergency Department (HOSPITAL_BASED_OUTPATIENT_CLINIC_OR_DEPARTMENT_OTHER)
Admission: EM | Admit: 2022-04-02 | Discharge: 2022-04-02 | Disposition: A | Discharge status: Home / Self Care | Payer: BLUE CROSS/BLUE SHIELD | Attending: Emergency Medicine | Admitting: Emergency Medicine

## 2022-04-02 DIAGNOSIS — K645 Perianal venous thrombosis: Secondary | ICD-10-CM

## 2022-04-02 DIAGNOSIS — K644 Residual hemorrhoidal skin tags: Secondary | ICD-10-CM | POA: Insufficient documentation

## 2022-04-02 DIAGNOSIS — Z87891 Personal history of nicotine dependence: Secondary | ICD-10-CM | POA: Diagnosis not present

## 2022-04-02 DIAGNOSIS — K6289 Other specified diseases of anus and rectum: Secondary | ICD-10-CM | POA: Diagnosis present

## 2022-04-02 MED ORDER — HYDROCODONE-ACETAMINOPHEN 5-325 MG PO TABS
1.0000 | ORAL_TABLET | Freq: Four times a day (QID) | ORAL | 0 refills | Status: DC | PRN
  Filled 2022-04-02: qty 10, 3d supply, fill #0

## 2022-04-02 MED ORDER — NAPROXEN 500 MG PO TABS
500.0000 mg | ORAL_TABLET | Freq: Two times a day (BID) | ORAL | 0 refills | Status: DC
  Filled 2022-04-02: qty 14, 7d supply, fill #0

## 2022-04-02 NOTE — Discharge Instructions (Addendum)
Take the Naprosyn on a regular basis.  If not taking the hydrocodone can take extra strength Tylenol with it.  Recommend warm soaks twice a day to the hemorrhoid area.  Can also apply over-the-counter hemorrhoidal creams.  Should start to improve over the next 3 days.  Return for any new or worse symptoms.  Work note provided.

## 2022-04-02 NOTE — ED Triage Notes (Signed)
Pt arrives to ED with c/o hemorrhoids. The pain started x2 days ago.

## 2022-04-02 NOTE — ED Provider Notes (Signed)
[facility] EMERGENCY DEPT Provider Note   CSN: [id number] Arrival date & time: 04/02/22  0831     History  Chief Complaint  Patient presents with   Hemorrhoids    Nichole Page is a 31 y.o. female.  The onset perianal pain 3 days ago.  On Saturday.  Has gotten worse today.  Patient did have hemorrhoid problems during her pregnancy many years ago.  But none since.  Denies any bleeding.  No treatment.  Past medical history significant for bipolar type II disorder.  Patient is a former smoker.       Home Medications Prior to Admission medications   Medication Sig Start Date End Date Taking? Authorizing Provider  naproxen (NAPROSYN) 500 MG tablet Take 1 tablet (500 mg total) by mouth 2 (two) times daily. 04/02/22  Yes Vanetta Mulders, MD  metroNIDAZOLE (FLAGYL) 500 MG tablet Take 1 tablet (500 mg total) by mouth 2 (two) times daily. No alcohol while taking this medication Patient not taking: Reported on 12/08/2021 05/05/19   Currie Paris, NP  pantoprazole (PROTONIX) 20 MG tablet Take 1 tablet (20 mg total) by mouth daily. Patient not taking: Reported on 12/08/2021 11/16/20   Deeann Saint, MD  Vitamin D, Ergocalciferol, (DRISDOL) 1.25 MG (50000 UNIT) CAPS capsule Take 1 capsule (50,000 Units total) by mouth every 7 (seven) days. 12/14/21   Deeann Saint, MD      Allergies    Apple juice    Review of Systems   Review of Systems  Constitutional:  Negative for chills and fever.  HENT:  Negative for rhinorrhea and sore throat.   Eyes:  Negative for visual disturbance.  Respiratory:  Negative for cough and shortness of breath.   Cardiovascular:  Negative for chest pain and leg swelling.  Gastrointestinal:  Positive for rectal pain. Negative for abdominal pain, anal bleeding, diarrhea, nausea and vomiting.  Genitourinary:  Negative for dysuria.  Musculoskeletal:  Negative for back pain and neck pain.  Skin:  Negative for rash.  Neurological:   Negative for dizziness, light-headedness and headaches.  Hematological:  Does not bruise/bleed easily.  Psychiatric/Behavioral:  Negative for confusion.     Physical Exam Updated Vital Signs BP (!) 137/100 (BP Location: Right Arm)   Pulse 72   Temp 98.4 F (36.9 C) (Temporal)   Resp 14   Ht 1.702 m (5\' 7" )   Wt 96.2 kg   SpO2 98%   BMI 33.20 kg/m  Physical Exam Vitals and nursing note reviewed.  Constitutional:      General: She is not in acute distress.    Appearance: She is well-developed.  HENT:     Head: Normocephalic and atraumatic.  Eyes:     Conjunctiva/sclera: Conjunctivae normal.  Cardiovascular:     Rate and Rhythm: Normal rate and regular rhythm.     Heart sounds: No murmur heard. Pulmonary:     Effort: Pulmonary effort is normal. No respiratory distress.     Breath sounds: Normal breath sounds.  Abdominal:     Palpations: Abdomen is soft.     Tenderness: There is no abdominal tenderness.  Genitourinary:    Comments: Patient with 2 inflamed external hemorrhoids.  No obvious blood clots in them.  But most likely the cause.  One is larger than the other.  Tender to touch.  No bleeding.  No ulceration. Musculoskeletal:        General: No swelling.     Cervical back: Neck supple.  Skin:  General: Skin is warm and dry.     Capillary Refill: Capillary refill takes less than 2 seconds.  Neurological:     General: No focal deficit present.     Mental Status: She is alert and oriented to person, place, and time.  Psychiatric:        Mood and Affect: Mood normal.     ED Results / Procedures / Treatments   Labs (all labs ordered are listed, but only abnormal results are displayed) Labs Reviewed - No data to display  EKG None  Radiology No results found.  Procedures Procedures    Medications Ordered in ED Medications - No data to display  ED Course/ Medical Decision Making/ A&P                           Medical Decision  Making Risk Prescription drug management.   Patient with most likely thrombosed external hemorrhoids but no visible clot.  No ulceration.  Will treat symptomatically at this point in time.  Will most likely heal on its own.  Will have patient take Naprosyn along with extra strength Tylenol we will also give hydrocodone she will be to take Tylenol with that for additional pain.  Work note provided.  Warm soaks and over-the-counter hemorrhoidal creams.   Final Clinical Impression(s) / ED Diagnoses Final diagnoses:  External thrombosed hemorrhoids    Rx / DC Orders ED Discharge Orders          Ordered    naproxen (NAPROSYN) 500 MG tablet  2 times daily        04/02/22 1043              Vanetta Mulders, MD 04/02/22 1047

## 2022-05-14 ENCOUNTER — Ambulatory Visit (INDEPENDENT_AMBULATORY_CARE_PROVIDER_SITE_OTHER): Payer: BLUE CROSS/BLUE SHIELD

## 2022-05-14 ENCOUNTER — Other Ambulatory Visit (HOSPITAL_COMMUNITY)
Admission: RE | Admit: 2022-05-14 | Discharge: 2022-05-14 | Disposition: A | Discharge status: Home / Self Care | Payer: BLUE CROSS/BLUE SHIELD | Source: Ambulatory Visit | Attending: Obstetrics and Gynecology | Admitting: Obstetrics and Gynecology

## 2022-05-14 VITALS — BP 127/87 | HR 84 | Ht 67.0 in | Wt 218.5 lb

## 2022-05-14 DIAGNOSIS — Z113 Encounter for screening for infections with a predominantly sexual mode of transmission: Secondary | ICD-10-CM | POA: Insufficient documentation

## 2022-05-14 DIAGNOSIS — R3915 Urgency of urination: Secondary | ICD-10-CM | POA: Diagnosis not present

## 2022-05-14 LAB — POCT URINALYSIS DIPSTICK
Bilirubin, UA: NEGATIVE
Blood, UA: POSITIVE
Glucose, UA: NEGATIVE
Ketones, UA: POSITIVE
Nitrite, UA: NEGATIVE
Protein, UA: NEGATIVE
Spec Grav, UA: 1.025 (ref 1.010–1.025)
Urobilinogen, UA: 0.2 E.U./dL
pH, UA: 5 (ref 5.0–8.0)

## 2022-05-14 LAB — POCT URINE PREGNANCY: Preg Test, Ur: NEGATIVE

## 2022-05-14 MED ORDER — NITROFURANTOIN MONOHYD MACRO 100 MG PO CAPS: 100.0000 mg | ORAL_CAPSULE | Freq: Two times a day (BID) | ORAL | 0 refills | Status: DC

## 2022-05-14 NOTE — Progress Notes (Signed)
SUBJECTIVE:  32 y.o. female complains of vaginal odor. Denies abnormal vaginal bleeding or significant pelvic pain or fever. Having urinary urgency and frequency. Denies history of known exposure to STD. Pt reports having abnormal period last month and took a lot of plan B. Patient requesting STD screening and UPT.  LMP: 04/03/22  OBJECTIVE:  She appears well, afebrile. Urine dipstick: positive for RBC's, positive for leukocytes, and positive for ketones.  ASSESSMENT:  Vaginal Odor UPT: Negative   PLAN:  GC, chlamydia, trichomonas, BVAG, CVAG probe sent to lab. Treatment: To be determined once lab results are received ROV prn if symptoms persist or worsen.  Macrobid sent to pharmacy per protocol.

## 2022-05-15 LAB — CERVICOVAGINAL ANCILLARY ONLY
Bacterial Vaginitis (gardnerella): POSITIVE — AB
Candida Glabrata: NEGATIVE
Candida Vaginitis: NEGATIVE
Chlamydia: NEGATIVE
Comment: NEGATIVE
Comment: NEGATIVE
Comment: NEGATIVE
Comment: NEGATIVE
Comment: NEGATIVE
Comment: NORMAL
Neisseria Gonorrhea: NEGATIVE
Trichomonas: NEGATIVE

## 2022-05-15 LAB — HIV ANTIBODY (ROUTINE TESTING W REFLEX): HIV Screen 4th Generation wRfx: NONREACTIVE

## 2022-05-15 LAB — HEPATITIS B SURFACE ANTIGEN: Hepatitis B Surface Ag: NEGATIVE

## 2022-05-15 LAB — RPR: RPR Ser Ql: NONREACTIVE

## 2022-05-15 LAB — HEPATITIS C ANTIBODY: Hep C Virus Ab: NONREACTIVE

## 2022-05-16 LAB — URINE CULTURE

## 2022-05-16 MED ORDER — METRONIDAZOLE 500 MG PO TABS: 500.0000 mg | ORAL_TABLET | Freq: Two times a day (BID) | ORAL | 0 refills | Status: DC

## 2022-05-16 NOTE — Addendum Note (Signed)
Addended by: Mora Bellman on: 05/16/2022 11:50 AM   Modules accepted: Orders

## 2022-08-06 ENCOUNTER — Other Ambulatory Visit (HOSPITAL_COMMUNITY)
Admission: RE | Admit: 2022-08-06 | Discharge: 2022-08-06 | Disposition: A | Discharge status: Home / Self Care | Payer: BLUE CROSS/BLUE SHIELD | Source: Ambulatory Visit | Attending: Obstetrics and Gynecology | Admitting: Obstetrics and Gynecology

## 2022-08-06 ENCOUNTER — Ambulatory Visit (INDEPENDENT_AMBULATORY_CARE_PROVIDER_SITE_OTHER): Payer: BLUE CROSS/BLUE SHIELD

## 2022-08-06 VITALS — BP 130/80 | HR 90 | Wt 210.0 lb

## 2022-08-06 DIAGNOSIS — Z113 Encounter for screening for infections with a predominantly sexual mode of transmission: Secondary | ICD-10-CM | POA: Insufficient documentation

## 2022-08-06 DIAGNOSIS — N898 Other specified noninflammatory disorders of vagina: Secondary | ICD-10-CM

## 2022-08-06 DIAGNOSIS — B9689 Other specified bacterial agents as the cause of diseases classified elsewhere: Secondary | ICD-10-CM | POA: Insufficient documentation

## 2022-08-06 DIAGNOSIS — N760 Acute vaginitis: Secondary | ICD-10-CM | POA: Insufficient documentation

## 2022-08-06 NOTE — Progress Notes (Signed)
SUBJECTIVE:  32 y.o. female who desires a STI screen. Denies bleeding or significant pelvic pain. No UTI symptoms.   LMP 07/15/22  OBJECTIVE:  She appears well.   ASSESSMENT:  STI Screen   PLAN:  Pt offered STI blood screening-requested GC, chlamydia, and trichomonas probe sent to lab.  Treatment: To be determined once lab results are received.  Pt follow up as needed.

## 2022-08-07 LAB — CERVICOVAGINAL ANCILLARY ONLY
Bacterial Vaginitis (gardnerella): POSITIVE — AB
Candida Glabrata: NEGATIVE
Candida Vaginitis: NEGATIVE
Chlamydia: NEGATIVE
Comment: NEGATIVE
Comment: NEGATIVE
Comment: NEGATIVE
Comment: NEGATIVE
Comment: NEGATIVE
Comment: NORMAL
Neisseria Gonorrhea: NEGATIVE
Trichomonas: NEGATIVE

## 2022-08-07 LAB — HIV ANTIBODY (ROUTINE TESTING W REFLEX): HIV Screen 4th Generation wRfx: NONREACTIVE

## 2022-08-07 LAB — HEPATITIS C ANTIBODY: Hep C Virus Ab: NONREACTIVE

## 2022-08-07 LAB — HEPATITIS B SURFACE ANTIGEN: Hepatitis B Surface Ag: NEGATIVE

## 2022-08-07 LAB — RPR: RPR Ser Ql: NONREACTIVE

## 2022-08-08 ENCOUNTER — Ambulatory Visit: Payer: BLUE CROSS/BLUE SHIELD | Admitting: Family Medicine

## 2022-08-09 ENCOUNTER — Encounter: Payer: Self-pay | Admitting: Family Medicine

## 2022-08-09 ENCOUNTER — Ambulatory Visit (INDEPENDENT_AMBULATORY_CARE_PROVIDER_SITE_OTHER): Payer: BLUE CROSS/BLUE SHIELD | Admitting: Family Medicine

## 2022-08-09 VITALS — BP 120/84 | HR 92 | Temp 99.0°F | Wt 209.8 lb

## 2022-08-09 DIAGNOSIS — R195 Other fecal abnormalities: Secondary | ICD-10-CM | POA: Diagnosis not present

## 2022-08-09 DIAGNOSIS — R1084 Generalized abdominal pain: Secondary | ICD-10-CM

## 2022-08-09 DIAGNOSIS — R1310 Dysphagia, unspecified: Secondary | ICD-10-CM | POA: Diagnosis not present

## 2022-08-09 DIAGNOSIS — R112 Nausea with vomiting, unspecified: Secondary | ICD-10-CM

## 2022-08-09 LAB — CBC WITH DIFFERENTIAL/PLATELET
Basophils Absolute: 0 10*3/uL (ref 0.0–0.1)
Basophils Relative: 0.4 % (ref 0.0–3.0)
Eosinophils Absolute: 0.1 10*3/uL (ref 0.0–0.7)
Eosinophils Relative: 1.4 % (ref 0.0–5.0)
HCT: 38.4 % (ref 36.0–46.0)
Hemoglobin: 13.1 g/dL (ref 12.0–15.0)
Lymphocytes Relative: 34.2 % (ref 12.0–46.0)
Lymphs Abs: 2 10*3/uL (ref 0.7–4.0)
MCHC: 34 g/dL (ref 30.0–36.0)
MCV: 99.6 fl (ref 78.0–100.0)
Monocytes Absolute: 0.5 10*3/uL (ref 0.1–1.0)
Monocytes Relative: 8.1 % (ref 3.0–12.0)
Neutro Abs: 3.3 10*3/uL (ref 1.4–7.7)
Neutrophils Relative %: 55.9 % (ref 43.0–77.0)
Platelets: 217 10*3/uL (ref 150.0–400.0)
RBC: 3.86 Mil/uL — ABNORMAL LOW (ref 3.87–5.11)
RDW: 13.6 % (ref 11.5–15.5)
WBC: 5.8 10*3/uL (ref 4.0–10.5)

## 2022-08-09 LAB — COMPREHENSIVE METABOLIC PANEL
ALT: 25 U/L (ref 0–35)
AST: 34 U/L (ref 0–37)
Albumin: 4.2 g/dL (ref 3.5–5.2)
Alkaline Phosphatase: 60 U/L (ref 39–117)
BUN: 7 mg/dL (ref 6–23)
CO2: 28 mEq/L (ref 19–32)
Calcium: 9.7 mg/dL (ref 8.4–10.5)
Chloride: 101 mEq/L (ref 96–112)
Creatinine, Ser: 0.63 mg/dL (ref 0.40–1.20)
GFR: 116.99 mL/min (ref >60.00)
Glucose, Bld: 92 mg/dL (ref 70–99)
Potassium: 3.9 mEq/L (ref 3.5–5.1)
Sodium: 138 mEq/L (ref 135–145)
Total Bilirubin: 1 mg/dL (ref 0.2–1.2)
Total Protein: 7.7 g/dL (ref 6.0–8.3)

## 2022-08-09 LAB — MAGNESIUM: Magnesium: 1.7 mg/dL (ref 1.5–2.5)

## 2022-08-09 LAB — GAMMA GT: GGT: 75 U/L — ABNORMAL HIGH (ref 7–51)

## 2022-08-09 LAB — LIPASE: Lipase: 18 U/L (ref 11.0–59.0)

## 2022-08-09 LAB — TSH: TSH: 0.53 u[IU]/mL (ref 0.35–5.50)

## 2022-08-09 LAB — T4, FREE: Free T4: 0.93 ng/dL (ref 0.60–1.60)

## 2022-08-09 MED ORDER — ONDANSETRON HCL 4 MG PO TABS: 4.0000 mg | ORAL_TABLET | Freq: Three times a day (TID) | ORAL | 0 refills | Status: DC | PRN

## 2022-08-09 MED ORDER — OMEPRAZOLE 20 MG PO CPDR
20.0000 mg | DELAYED_RELEASE_CAPSULE | Freq: Every day | ORAL | 3 refills | Status: DC
Start: 2022-08-09 — End: 2022-11-05

## 2022-08-09 NOTE — Patient Instructions (Signed)
Referral was placed for gastroenterology.  You should expect a phone call about scheduling this appointment.  A prescription for Zofran and nausea medication was sent to your pharmacy.  A prescription for omeprazole was also sent.  This helps decrease stomach acid.

## 2022-08-09 NOTE — Progress Notes (Signed)
Established Patient Office Visit   Subjective  Patient ID: Nichole Page, female    DOB: 1991/03/17  Age: 32 y.o. MRN: [id number]  Chief Complaint  Patient presents with   GI Problem    Patient is a 32 year old female who presents for acute concern.  Patient endorses worsening GI issues x 2 weeks.  Patient with diarrhea, dysphagia, nausea, emesis.  Liquids typically stay down, but foods do not.  Patient endorses emesis x 30 minutes first thing every morning.  At baseline patient endorses a dull throb in stomach that can become sharper after eating food.  Endorses generalized abdominal pain.  Stools yellow-in color with mucus, occasionally dark black.  Patient notes symptoms started after she stopped smoking MJ daily last month.  Prior to last month patient may have indulged in Berkley 1-2 times per month.  Patient is also noticed headaches, blurred vision, memory issues.  States was unable to remember her phone number or this provider's name when she checked in for appointment this morning.  GI Problem The primary symptoms include fatigue, nausea, vomiting and diarrhea. The illness began more than 7 days ago (2 wks.).  LMP 07/15/2022.  Patient Active Problem List   Diagnosis Date Noted   Adult BMI 35.0-35.9 kg/sq m 04/29/2019   Fatigue 12/29/2015   Bipolar 2 disorder 12/29/2015   Past Surgical History:  Procedure Laterality Date   I & D EXTREMITY  04/02/2012   Procedure: IRRIGATION AND DEBRIDEMENT EXTREMITY;  Surgeon: Johnn Hai, MD;  Location: [place];  Service: Orthopedics;  Laterality: Left;   I & D EXTREMITY  04/04/2012   Procedure: IRRIGATION AND DEBRIDEMENT EXTREMITY;  Surgeon: Johnn Hai, MD;  Location: [place];  Service: Orthopedics;  Laterality: Left;   NO PAST SURGERIES     ORIF ANKLE FRACTURE  04/02/2012   Procedure: OPEN REDUCTION INTERNAL FIXATION (ORIF) ANKLE FRACTURE;  Surgeon: Johnn Hai, MD;  Location: [place];  Service: Orthopedics;  Laterality: Left;    Social History   Tobacco Use   Smoking status: Former    Passive exposure: Never   Smokeless tobacco: Former  Scientific laboratory technician Use: Some days  Substance Use Topics   Alcohol use: Yes    Alcohol/week: 3.0 standard drinks of alcohol    Types: 3 Glasses of wine per week    Comment: pt states she drinks a few times a week   Drug use: No    Frequency: 1.0 times per week   Family History  Problem Relation Age of Onset   Diabetes Father    Stroke Father    Alcohol abuse Father    Arthritis Father    Cerebrovascular Accident Father    Fibromyalgia Mother    Alcohol abuse Mother    Arthritis Mother    Breast cancer Sister 63       half sister    Breast cancer Maternal Aunt 71   Breast cancer Paternal Aunt    Breast cancer Maternal Grandmother        early 27s   Allergies  Allergen Reactions   Apple Juice Nausea And Vomiting and Other (See Comments)    Eyes swell      Review of Systems  Constitutional:  Positive for fatigue.  Gastrointestinal:  Positive for diarrhea, nausea and vomiting.   Negative unless stated above    Objective:     BP 120/84 (BP Location: Left Arm, Patient Position: Sitting, Cuff Size: Large)   Pulse 92  Temp 99 F (37.2 C) (Oral)   Wt 209 lb 12.8 oz (95.2 kg)   LMP 07/15/2022   SpO2 98%   BMI 32.86 kg/m    Physical Exam Constitutional:      General: She is not in acute distress.    Appearance: Normal appearance.  HENT:     Head: Normocephalic and atraumatic.     Nose: Nose normal.     Mouth/Throat:     Mouth: Mucous membranes are moist.  Cardiovascular:     Rate and Rhythm: Normal rate and regular rhythm.     Heart sounds: Normal heart sounds. No murmur heard.    No gallop.  Pulmonary:     Effort: Pulmonary effort is normal. No respiratory distress.     Breath sounds: Normal breath sounds. No wheezing, rhonchi or rales.  Abdominal:     General: Bowel sounds are normal.     Palpations: Abdomen is soft.     Tenderness:  There is generalized abdominal tenderness.     Comments: Generalized abdominal pain, greater in LLQ  Skin:    General: Skin is warm and dry.  Neurological:     Mental Status: She is alert and oriented to person, place, and time.     No results found for any visits on 08/09/22.    Assessment & Plan:  Generalized abdominal pain -     Comprehensive metabolic panel -     Lipase -     CBC with Differential/Platelet -     Magnesium -     Omeprazole; Take 1 capsule (20 mg total) by mouth daily.  Dispense: 30 capsule; Refill: 3 -     Ambulatory referral to Gastroenterology -     TSH -     T4, free -     Gamma GT  Dysphagia, unspecified type -     Comprehensive metabolic panel -     Lipase -     CBC with Differential/Platelet -     Magnesium -     Omeprazole; Take 1 capsule (20 mg total) by mouth daily.  Dispense: 30 capsule; Refill: 3 -     Ambulatory referral to Gastroenterology  Nausea and vomiting, unspecified vomiting type -     Comprehensive metabolic panel -     Lipase -     CBC with Differential/Platelet -     Magnesium -     Omeprazole; Take 1 capsule (20 mg total) by mouth daily.  Dispense: 30 capsule; Refill: 3 -     Ondansetron HCl; Take 1 tablet (4 mg total) by mouth every 8 (eight) hours as needed for nausea or vomiting.  Dispense: 20 tablet; Refill: 0 -     Ambulatory referral to Gastroenterology -     TSH -     T4, free  Loose stools -     Comprehensive metabolic panel -     Lipase -     CBC with Differential/Platelet -     Magnesium -     Ambulatory referral to Gastroenterology -     TSH -     T4, free  Discussed possible causes of symptoms including acid reflux, gallstones, hepatic dysfunction, thyroid dysfunction, cannabis hyperemesis, pregnancy.  Pregnancy less likely as LMP 07/15/2022.  Discussed obtaining labs to evaluate for possible causes and electrolyte abnormalities due to continued fluid loss.  Will start omeprazole.  Zofran as needed.  Referral  to GI.  Obtain imaging if needed based on labs.  No  follow-ups on file.   Billie Ruddy, MD

## 2022-08-10 ENCOUNTER — Encounter: Payer: Self-pay | Admitting: Gastroenterology

## 2022-09-14 ENCOUNTER — Other Ambulatory Visit: Payer: Self-pay | Admitting: Certified Nurse Midwife

## 2022-09-14 DIAGNOSIS — R112 Nausea with vomiting, unspecified: Secondary | ICD-10-CM

## 2022-09-14 DIAGNOSIS — T7421XA Adult sexual abuse, confirmed, initial encounter: Secondary | ICD-10-CM | POA: Insufficient documentation

## 2022-09-14 MED ORDER — METRONIDAZOLE 500 MG PO TABS
500.0000 mg | ORAL_TABLET | Freq: Two times a day (BID) | ORAL | 0 refills | Status: DC
Start: 2022-09-14 — End: 2022-11-05

## 2022-09-14 MED ORDER — EMTRICITABINE-TENOFOVIR DF 200-300 MG PO TABS
1.0000 | ORAL_TABLET | Freq: Every day | ORAL | 0 refills | Status: DC
Start: 2022-09-14 — End: 2022-11-05

## 2022-09-14 MED ORDER — RALTEGRAVIR POTASSIUM 400 MG PO TABS
400.0000 mg | ORAL_TABLET | Freq: Two times a day (BID) | ORAL | 0 refills | Status: AC
Start: 2022-09-14 — End: 2022-10-12

## 2022-09-14 MED ORDER — DOXYCYCLINE HYCLATE 100 MG PO TABS
100.0000 mg | ORAL_TABLET | Freq: Two times a day (BID) | ORAL | 0 refills | Status: AC
Start: 2022-09-14 — End: 2022-09-21

## 2022-09-14 MED ORDER — ONDANSETRON HCL 4 MG PO TABS
4.0000 mg | ORAL_TABLET | Freq: Three times a day (TID) | ORAL | 0 refills | Status: DC | PRN
Start: 2022-09-14 — End: 2022-11-05
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
## 2022-09-17 ENCOUNTER — Other Ambulatory Visit (HOSPITAL_COMMUNITY)
Admission: RE | Admit: 2022-09-17 | Discharge: 2022-09-17 | Disposition: A | Discharge status: Home / Self Care | Payer: BLUE CROSS/BLUE SHIELD | Source: Ambulatory Visit | Attending: Family Medicine | Admitting: Family Medicine

## 2022-09-17 ENCOUNTER — Ambulatory Visit (INDEPENDENT_AMBULATORY_CARE_PROVIDER_SITE_OTHER): Payer: BLUE CROSS/BLUE SHIELD

## 2022-09-17 VITALS — BP 141/94 | HR 93

## 2022-09-17 DIAGNOSIS — T7421XA Adult sexual abuse, confirmed, initial encounter: Secondary | ICD-10-CM | POA: Diagnosis present

## 2022-09-17 DIAGNOSIS — Z202 Contact with and (suspected) exposure to infections with a predominantly sexual mode of transmission: Secondary | ICD-10-CM | POA: Diagnosis present

## 2022-09-17 MED ORDER — FLUCONAZOLE 150 MG PO TABS
150.0000 mg | ORAL_TABLET | Freq: Once | ORAL | 0 refills | Status: AC
Start: 2022-09-17 — End: 2022-09-17

## 2022-09-17 MED ORDER — CEFTRIAXONE SODIUM 500 MG IJ SOLR
500.0000 mg | Freq: Once | INTRAMUSCULAR | Status: AC
Start: 2022-09-17 — End: 2022-09-17
  Administered 2022-09-17: 500 mg via INTRAMUSCULAR
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
## 2022-09-18 LAB — COMPREHENSIVE METABOLIC PANEL
ALT: 31 IU/L (ref 0–32)
AST: 58 IU/L — ABNORMAL HIGH (ref 0–40)
Albumin/Globulin Ratio: 1.5 (ref 1.2–2.2)
Albumin: 4.7 g/dL (ref 3.9–4.9)
Alkaline Phosphatase: 76 IU/L (ref 44–121)
BUN/Creatinine Ratio: 10 (ref 9–23)
BUN: 7 mg/dL (ref 6–20)
Bilirubin Total: 1.4 mg/dL — ABNORMAL HIGH (ref 0.0–1.2)
CO2: 22 mmol/L (ref 20–29)
Calcium: 9.8 mg/dL (ref 8.7–10.2)
Chloride: 100 mmol/L (ref 96–106)
Creatinine, Ser: 0.7 mg/dL (ref 0.57–1.00)
Globulin, Total: 3.2 g/dL (ref 1.5–4.5)
Glucose: 112 mg/dL — ABNORMAL HIGH (ref 70–99)
Potassium: 3.9 mmol/L (ref 3.5–5.2)
Sodium: 144 mmol/L (ref 134–144)
Total Protein: 7.9 g/dL (ref 6.0–8.5)
eGFR: 117 mL/min/{1.73_m2} (ref >59)

## 2022-09-18 LAB — CERVICOVAGINAL ANCILLARY ONLY
Bacterial Vaginitis (gardnerella): POSITIVE — AB
Chlamydia: NEGATIVE
Comment: NEGATIVE
Comment: NEGATIVE
Comment: NEGATIVE
Comment: NORMAL
Neisseria Gonorrhea: NEGATIVE
Trichomonas: NEGATIVE

## 2022-09-18 LAB — HEPATITIS B SURFACE ANTIGEN: Hepatitis B Surface Ag: NEGATIVE

## 2022-09-18 LAB — RPR: RPR Ser Ql: NONREACTIVE

## 2022-09-18 LAB — HEPATITIS C ANTIBODY: Hep C Virus Ab: NONREACTIVE

## 2022-09-18 LAB — HIV ANTIBODY (ROUTINE TESTING W REFLEX): HIV Screen 4th Generation wRfx: NONREACTIVE

## 2022-09-18 LAB — POCT PREGNANCY, URINE: Preg Test, Ur: NEGATIVE

## 2022-10-03 ENCOUNTER — Ambulatory Visit: Payer: BLUE CROSS/BLUE SHIELD

## 2022-10-15 ENCOUNTER — Ambulatory Visit: Payer: BLUE CROSS/BLUE SHIELD | Admitting: Gastroenterology

## 2022-10-15 ENCOUNTER — Other Ambulatory Visit: Payer: BLUE CROSS/BLUE SHIELD

## 2022-10-29 ENCOUNTER — Encounter: Payer: Self-pay | Admitting: Certified Nurse Midwife

## 2022-10-29 MED ORDER — METRONIDAZOLE 0.75 % VA GEL: 1.0000 | Freq: Every day | VAGINAL | 0 refills | Status: DC

## 2022-10-31 ENCOUNTER — Other Ambulatory Visit: Payer: Self-pay

## 2022-10-31 ENCOUNTER — Other Ambulatory Visit (HOSPITAL_COMMUNITY)
Admission: RE | Admit: 2022-10-31 | Discharge: 2022-10-31 | Disposition: A | Discharge status: Home / Self Care | Payer: BLUE CROSS/BLUE SHIELD | Source: Ambulatory Visit | Attending: Family Medicine | Admitting: Family Medicine

## 2022-10-31 ENCOUNTER — Ambulatory Visit (INDEPENDENT_AMBULATORY_CARE_PROVIDER_SITE_OTHER): Payer: BLUE CROSS/BLUE SHIELD

## 2022-10-31 ENCOUNTER — Ambulatory Visit: Payer: BLUE CROSS/BLUE SHIELD

## 2022-10-31 VITALS — BP 127/85 | HR 98

## 2022-10-31 DIAGNOSIS — T7421XD Adult sexual abuse, confirmed, subsequent encounter: Secondary | ICD-10-CM | POA: Diagnosis present

## 2022-10-31 DIAGNOSIS — R8290 Unspecified abnormal findings in urine: Secondary | ICD-10-CM

## 2022-10-31 DIAGNOSIS — R300 Dysuria: Secondary | ICD-10-CM | POA: Diagnosis not present

## 2022-10-31 LAB — POCT URINALYSIS DIP (DEVICE)
Bilirubin Urine: NEGATIVE
Glucose, UA: NEGATIVE mg/dL
Ketones, ur: NEGATIVE mg/dL
Nitrite: NEGATIVE
Protein, ur: 100 mg/dL — AB
Specific Gravity, Urine: 1.01 (ref 1.005–1.030)
Urobilinogen, UA: 1 mg/dL (ref 0.0–1.0)
pH: 6 (ref 5.0–8.0)

## 2022-10-31 MED ORDER — NITROFURANTOIN MONOHYD MACRO 100 MG PO CAPS
100.0000 mg | ORAL_CAPSULE | Freq: Two times a day (BID) | ORAL | 0 refills | Status: DC
Start: 2022-10-31 — End: 2022-11-05

## 2022-10-31 MED ORDER — PHENAZOPYRIDINE HCL 200 MG PO TABS
200.0000 mg | ORAL_TABLET | Freq: Three times a day (TID) | ORAL | 0 refills | Status: DC | PRN
Start: 2022-10-31 — End: 2023-07-31

## 2022-10-31 MED ORDER — PHENAZOPYRIDINE HCL 200 MG PO TABS
200.0000 mg | ORAL_TABLET | Freq: Three times a day (TID) | ORAL | 0 refills | Status: DC | PRN
Start: 2022-10-31 — End: 2022-10-31
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
## 2022-11-01 LAB — CERVICOVAGINAL ANCILLARY ONLY
Bacterial Vaginitis (gardnerella): POSITIVE — AB
Candida Glabrata: NEGATIVE
Candida Vaginitis: NEGATIVE
Chlamydia: NEGATIVE
Comment: NEGATIVE
Comment: NEGATIVE
Comment: NEGATIVE
Comment: NEGATIVE
Comment: NEGATIVE
Comment: NORMAL
Neisseria Gonorrhea: NEGATIVE
Trichomonas: NEGATIVE

## 2022-11-01 LAB — HEPATITIS B SURFACE ANTIGEN: Hepatitis B Surface Ag: NEGATIVE

## 2022-11-01 LAB — HEPATITIS C ANTIBODY: Hep C Virus Ab: NONREACTIVE

## 2022-11-01 LAB — HIV ANTIBODY (ROUTINE TESTING W REFLEX): HIV Screen 4th Generation wRfx: NONREACTIVE

## 2022-11-01 LAB — RPR: RPR Ser Ql: NONREACTIVE

## 2022-11-04 LAB — URINE CULTURE

## 2022-11-05 ENCOUNTER — Encounter: Payer: Self-pay | Admitting: Certified Nurse Midwife

## 2022-11-05 MED ORDER — SULFAMETHOXAZOLE-TRIMETHOPRIM 800-160 MG PO TABS: 1.0000 | ORAL_TABLET | Freq: Two times a day (BID) | ORAL | 0 refills | Status: AC

## 2022-11-05 MED ORDER — IBUPROFEN 600 MG PO TABS: 600.0000 mg | ORAL_TABLET | Freq: Four times a day (QID) | ORAL | 3 refills | Status: DC | PRN

## 2022-12-06 ENCOUNTER — Other Ambulatory Visit: Payer: Self-pay

## 2022-12-06 DIAGNOSIS — Z113 Encounter for screening for infections with a predominantly sexual mode of transmission: Secondary | ICD-10-CM

## 2022-12-10 ENCOUNTER — Other Ambulatory Visit: Payer: BLUE CROSS/BLUE SHIELD

## 2022-12-14 ENCOUNTER — Ambulatory Visit (HOSPITAL_COMMUNITY): Payer: BLUE CROSS/BLUE SHIELD

## 2023-03-01 ENCOUNTER — Encounter: Payer: Self-pay | Admitting: Family Medicine

## 2023-03-28 ENCOUNTER — Other Ambulatory Visit: Payer: Self-pay | Admitting: Certified Nurse Midwife

## 2023-03-28 DIAGNOSIS — Z803 Family history of malignant neoplasm of breast: Secondary | ICD-10-CM

## 2023-04-02 ENCOUNTER — Telehealth: Payer: Self-pay | Admitting: Certified Nurse Midwife

## 2023-04-02 DIAGNOSIS — F4323 Adjustment disorder with mixed anxiety and depressed mood: Secondary | ICD-10-CM

## 2023-04-02 MED ORDER — SERTRALINE HCL 25 MG PO TABS
25.0000 mg | ORAL_TABLET | Freq: Every day | ORAL | 6 refills | Status: DC
Start: 2023-04-02 — End: 2023-07-31

## 2023-04-02 NOTE — Telephone Encounter (Signed)
Pt called in to discuss mental health and need for medication management. Began seeing a therapist for her anxiety which has increased sharply since the election (works in reproductive health justice). She is sleeping at night but finds it difficult to turn her brain off, struggling with feeling overwhelmed, worried constantly about friends/family and what may happen in the future. Therapist recommended some kind of medication management, increasing therapy to twice weekly, and a 2-6 week mental health break in a different setting (potentially inpatient but likely going to stay with a supportive friend).  Was diagnosed with bipolar 2 as a teenager but has never been medicated for this and symptomology more closely resembles that of female ADHD or cPTSD.  Has taken Vyvanse before and found this to be helpful. Reviewed options including sleep support, short or longer-acting anxiolytics vs ADHD type medication. Pt opted for a longer acting anxiolytic/anti-depressant. Will start on lowest dose of Zoloft, pt to be co-managed by therapist and has good supports in place.   Discussed need to avoid alcohol while adjusting to this medication, to which pt agrees and has plans in place to increase other coping skills. Will check in to report efficacy or troublesome side effects, as well as need for titration of dose.   Edd Arbour, CNM, MSN, IBCLC Certified Nurse Midwife, [facility]

## 2023-04-16 NOTE — Progress Notes (Signed)
 Appt canceled!

## 2023-04-17 DIAGNOSIS — Z113 Encounter for screening for infections with a predominantly sexual mode of transmission: Secondary | ICD-10-CM

## 2023-04-24 ENCOUNTER — Ambulatory Visit: Payer: BLUE CROSS/BLUE SHIELD

## 2023-07-31 ENCOUNTER — Other Ambulatory Visit (HOSPITAL_COMMUNITY)
Admission: RE | Admit: 2023-07-31 | Discharge: 2023-07-31 | Disposition: A | Discharge status: Home / Self Care | Source: Ambulatory Visit | Attending: Certified Nurse Midwife | Admitting: Certified Nurse Midwife

## 2023-07-31 ENCOUNTER — Other Ambulatory Visit: Payer: Self-pay

## 2023-07-31 ENCOUNTER — Ambulatory Visit: Admitting: Certified Nurse Midwife

## 2023-07-31 VITALS — BP 125/80 | HR 82 | Ht 67.0 in | Wt 222.0 lb

## 2023-07-31 DIAGNOSIS — F902 Attention-deficit hyperactivity disorder, combined type: Secondary | ICD-10-CM | POA: Diagnosis not present

## 2023-07-31 DIAGNOSIS — F419 Anxiety disorder, unspecified: Secondary | ICD-10-CM | POA: Insufficient documentation

## 2023-07-31 DIAGNOSIS — Z113 Encounter for screening for infections with a predominantly sexual mode of transmission: Secondary | ICD-10-CM | POA: Insufficient documentation

## 2023-07-31 MED ORDER — SERTRALINE HCL 50 MG PO TABS: 50.0000 mg | ORAL_TABLET | Freq: Every day | ORAL | 6 refills | Status: AC

## 2023-07-31 NOTE — Progress Notes (Signed)
 History:  Ms. Hudsyn Barich is a 33 y.o. G1P1001 who presents to clinic today for STI testing and to discuss increasing her zoloft. Feels it is working well but she has begun to plateau and would like to increase it. Sees a therapist twice monthly who agrees this would be prudent.   The following portions of the patient's history were reviewed and updated as appropriate: allergies, current medications, family history, past medical history, social history, past surgical history and problem list.  Review of Systems:  Pertinent items noted in HPI and remainder of comprehensive ROS otherwise negative.   Objective:  Physical Exam BP 125/80 (BP Location: Right Arm, Patient Position: Sitting, Cuff Size: Normal)   Pulse 82   Ht 5\' 7"  (1.702 m)   Wt 222 lb (100.7 kg)   LMP 06/20/2023   BMI 34.77 kg/m  Physical Exam Vitals and nursing note reviewed.  Constitutional:      General: She is not in acute distress.    Appearance: Normal appearance. She is not ill-appearing.  Eyes:     Pupils: Pupils are equal, round, and reactive to light.  Cardiovascular:     Rate and Rhythm: Normal rate and regular rhythm.  Pulmonary:     Effort: Pulmonary effort is normal.  Abdominal:     Palpations: Abdomen is soft.  Musculoskeletal:        General: Normal range of motion.  Skin:    General: Skin is warm and dry.     Capillary Refill: Capillary refill takes less than 2 seconds.  Neurological:     Mental Status: She is alert and oriented to person, place, and time.  Psychiatric:        Mood and Affect: Mood normal.        Behavior: Behavior normal.    Labs and Imaging No results found for this or any previous visit (from the past 24 hours).  No results found.   Assessment & Plan:  1. Screen for STD (sexually transmitted disease) (Primary) - RPR+HBsAg+HCVAb+... - Cervicovaginal ancillary only - Pap up to date  2. Anxiety - sertraline (ZOLOFT) 50 MG tablet; Take 1 tablet (50 mg total)  by mouth daily.  Dispense: 60 tablet; Refill: 6  3. ADHD (attention deficit hyperactivity disorder), combined type - Was likely misdiagnosed as bipolar, does not have swings that fit this diagnosis. Was diagnosed >70yrs ago when her daughter was younger. Therapist agrees with ADHD diagnosis, which is known to increase with luteal hormonal swings.   Follow up as needed or for annual exam.     Bernerd Limbo, CNM 07/31/2023 6:12 PM

## 2023-08-01 LAB — CERVICOVAGINAL ANCILLARY ONLY
Chlamydia: NEGATIVE
Comment: NEGATIVE
Comment: NEGATIVE
Comment: NORMAL
Neisseria Gonorrhea: NEGATIVE
Trichomonas: NEGATIVE

## 2023-08-02 LAB — RPR+HBSAG+HCVAB+...
HIV Screen 4th Generation wRfx: NONREACTIVE
Hep C Virus Ab: NONREACTIVE
Hepatitis B Surface Ag: NEGATIVE
RPR Ser Ql: NONREACTIVE

## 2023-12-18 ENCOUNTER — Ambulatory Visit: Admitting: Certified Nurse Midwife

## 2024-04-22 ENCOUNTER — Encounter: Admitting: Family Medicine

## 2024-06-03 ENCOUNTER — Ambulatory Visit: Admitting: Certified Nurse Midwife

## 2024-06-09 ENCOUNTER — Other Ambulatory Visit: Payer: Self-pay | Admitting: Family Medicine

## 2024-06-09 DIAGNOSIS — R1084 Generalized abdominal pain: Secondary | ICD-10-CM

## 2024-06-09 DIAGNOSIS — R1310 Dysphagia, unspecified: Secondary | ICD-10-CM

## 2024-06-09 DIAGNOSIS — R112 Nausea with vomiting, unspecified: Secondary | ICD-10-CM

## 2024-06-17 ENCOUNTER — Ambulatory Visit: Admitting: Certified Nurse Midwife
